# Patient Record
Sex: Female | Born: 1960 | Race: White | Hispanic: No | Marital: Single | Smoking: Current every day smoker
Health system: Southern US, Community
[De-identification: ages and names within clinical notes are randomized; demographics above are authoritative.]

## PROBLEM LIST (undated history)

## (undated) DIAGNOSIS — Z720 Tobacco use: Secondary | ICD-10-CM

## (undated) DIAGNOSIS — I639 Cerebral infarction, unspecified: Secondary | ICD-10-CM

## (undated) DIAGNOSIS — I236 Thrombosis of atrium, auricular appendage, and ventricle as current complications following acute myocardial infarction: Secondary | ICD-10-CM

## (undated) DIAGNOSIS — I209 Angina pectoris, unspecified: Secondary | ICD-10-CM

## (undated) DIAGNOSIS — R5600 Simple febrile convulsions: Secondary | ICD-10-CM

## (undated) DIAGNOSIS — I219 Acute myocardial infarction, unspecified: Secondary | ICD-10-CM

## (undated) DIAGNOSIS — I10 Essential (primary) hypertension: Secondary | ICD-10-CM

## (undated) DIAGNOSIS — C801 Malignant (primary) neoplasm, unspecified: Secondary | ICD-10-CM

## (undated) DIAGNOSIS — Z7902 Long term (current) use of antithrombotics/antiplatelets: Secondary | ICD-10-CM

## (undated) DIAGNOSIS — I5022 Chronic systolic (congestive) heart failure: Secondary | ICD-10-CM

## (undated) DIAGNOSIS — R569 Unspecified convulsions: Secondary | ICD-10-CM

## (undated) DIAGNOSIS — F32A Depression, unspecified: Secondary | ICD-10-CM

## (undated) DIAGNOSIS — Z7982 Long term (current) use of aspirin: Secondary | ICD-10-CM

## (undated) DIAGNOSIS — R0600 Dyspnea, unspecified: Secondary | ICD-10-CM

## (undated) DIAGNOSIS — F419 Anxiety disorder, unspecified: Secondary | ICD-10-CM

## (undated) DIAGNOSIS — Z955 Presence of coronary angioplasty implant and graft: Secondary | ICD-10-CM

## (undated) DIAGNOSIS — E785 Hyperlipidemia, unspecified: Secondary | ICD-10-CM

## (undated) DIAGNOSIS — I700 Atherosclerosis of aorta: Secondary | ICD-10-CM

## (undated) DIAGNOSIS — J45909 Unspecified asthma, uncomplicated: Secondary | ICD-10-CM

## (undated) DIAGNOSIS — I2510 Atherosclerotic heart disease of native coronary artery without angina pectoris: Secondary | ICD-10-CM

## (undated) DIAGNOSIS — I739 Peripheral vascular disease, unspecified: Secondary | ICD-10-CM

## (undated) HISTORY — DX: Hyperlipidemia, unspecified: E78.5

## (undated) HISTORY — PX: THYROIDECTOMY: SHX17

## (undated) HISTORY — PX: BACK SURGERY: SHX140

## (undated) HISTORY — DX: Cerebral infarction, unspecified: I63.9

## (undated) HISTORY — DX: Essential (primary) hypertension: I10

## (undated) HISTORY — DX: Acute myocardial infarction, unspecified: I21.9

## (undated) HISTORY — PX: ABDOMINAL HYSTERECTOMY: SHX81

## (undated) HISTORY — DX: Atherosclerotic heart disease of native coronary artery without angina pectoris: I25.10

## (undated) HISTORY — DX: Unspecified asthma, uncomplicated: J45.909

## (undated) HISTORY — DX: Malignant (primary) neoplasm, unspecified: C80.1

## (undated) HISTORY — PX: CORONARY ANGIOPLASTY: SHX604

## (undated) HISTORY — DX: Tobacco use: Z72.0

## (undated) HISTORY — PX: THROAT SURGERY: SHX803

## (undated) HISTORY — DX: Chronic systolic (congestive) heart failure: I50.22

---

## 2005-07-03 ENCOUNTER — Inpatient Hospital Stay: Payer: Self-pay | Admitting: Internal Medicine

## 2005-07-03 ENCOUNTER — Other Ambulatory Visit: Payer: Self-pay

## 2005-07-06 ENCOUNTER — Other Ambulatory Visit: Payer: Self-pay

## 2005-07-06 ENCOUNTER — Emergency Department: Payer: Self-pay | Admitting: Emergency Medicine

## 2005-07-28 ENCOUNTER — Emergency Department: Payer: Self-pay | Admitting: Emergency Medicine

## 2005-09-26 ENCOUNTER — Ambulatory Visit: Payer: Self-pay | Admitting: Otolaryngology

## 2006-12-31 ENCOUNTER — Emergency Department: Payer: Self-pay | Admitting: Emergency Medicine

## 2007-09-13 ENCOUNTER — Other Ambulatory Visit: Payer: Self-pay

## 2007-09-13 ENCOUNTER — Emergency Department: Payer: Self-pay | Admitting: Emergency Medicine

## 2007-11-07 IMAGING — CT CT CHEST W/ CM
2 series · 16 of 32 positions shown, 20 images · non-contrast
Comparison: none

REASON FOR EXAM: Shortness of breath//RM18
COMMENTS:

[Series 4: soft tissue · axial · 0.67mm/px · z∈[+261,+315]mm · 2 of 114 slices shown]
[im 9/114  mediastinal]
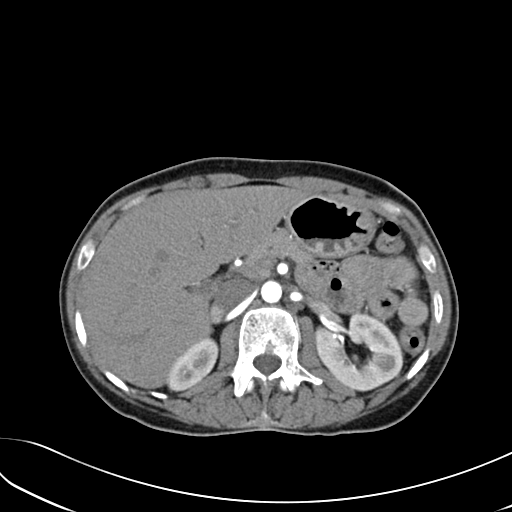
[im 27/114  mediastinal]
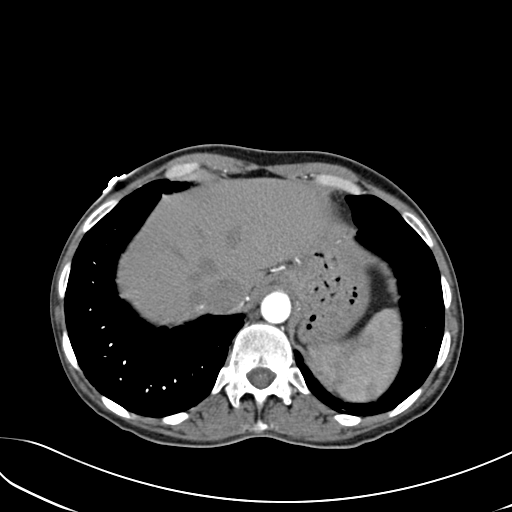

[Series 5: lung windows · axial · 0.67mm/px · z∈[+270,+549]mm · 14 of 111 slices shown, 18 images]
[im 9/111  mediastinal]
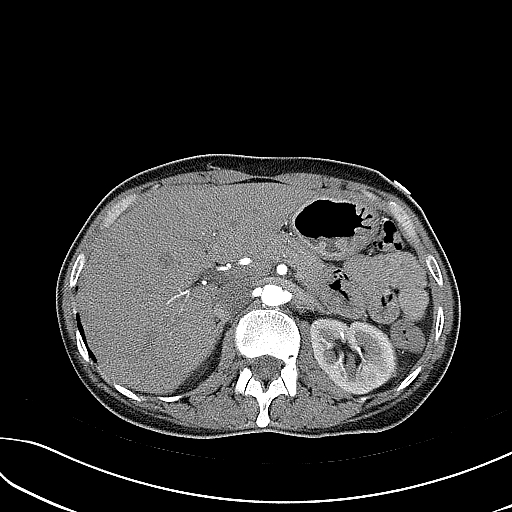
[im 9/111  lung]
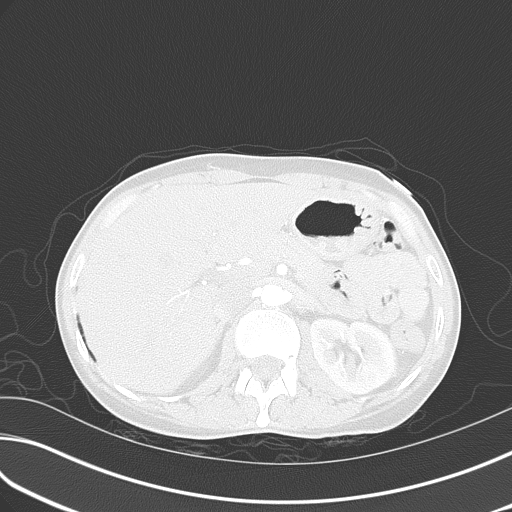
[im 17/111  lung]
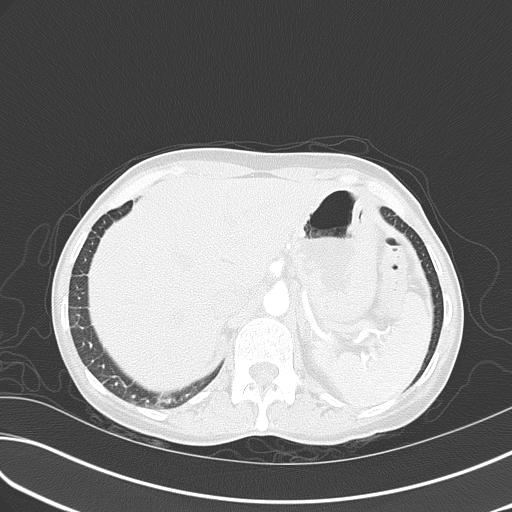
[im 26/111  lung]
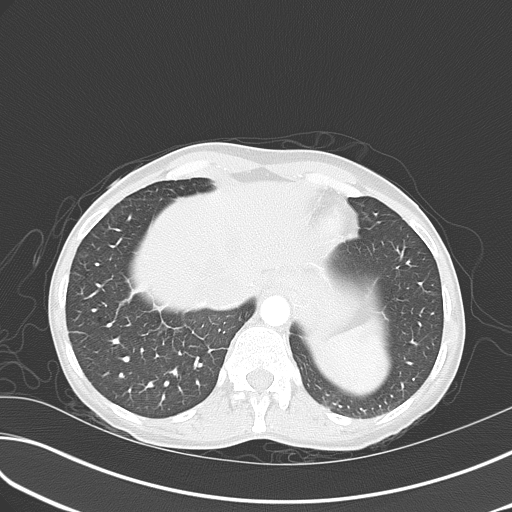
[im 34/111  lung]
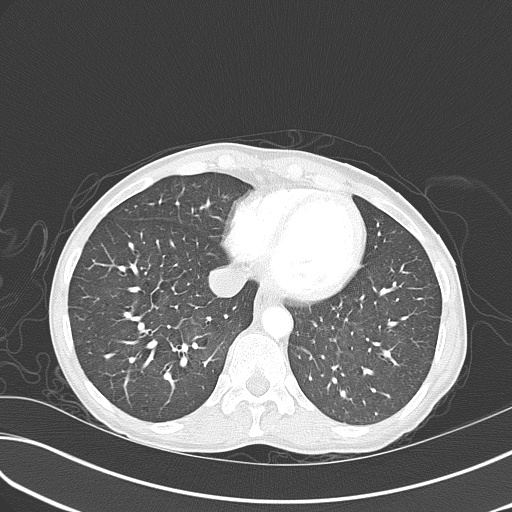
[im 43/111  mediastinal]
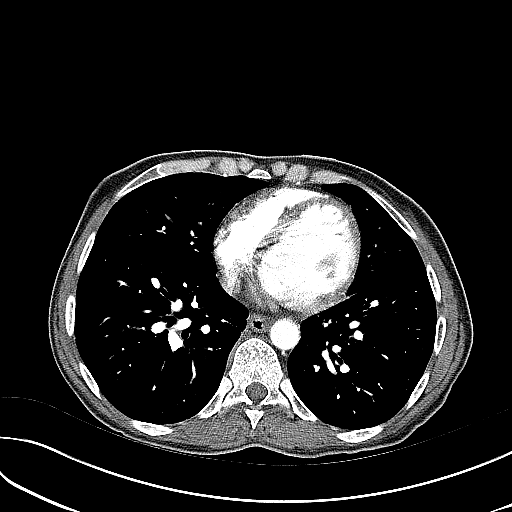
[im 43/111  lung]
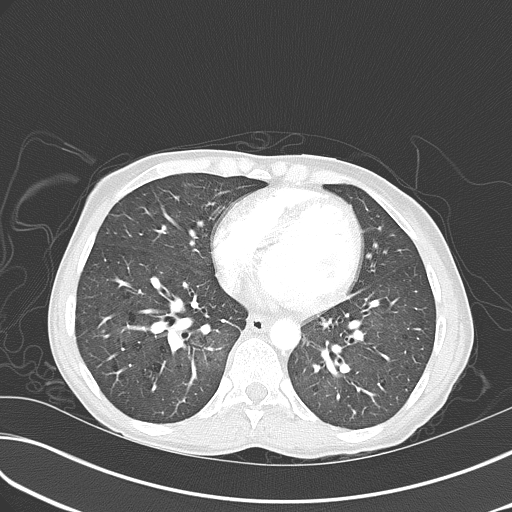
[im 51/111  lung]
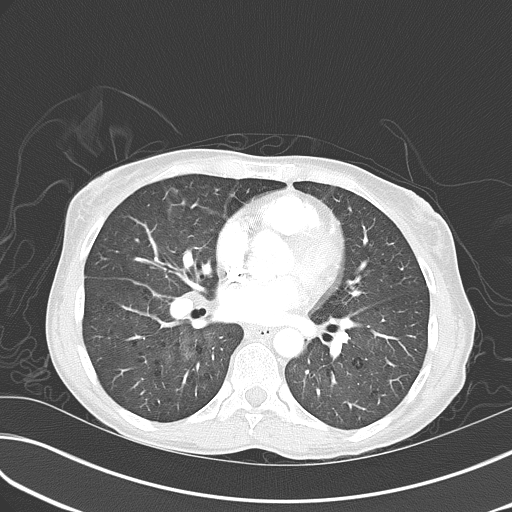
[im 52/111  lung]
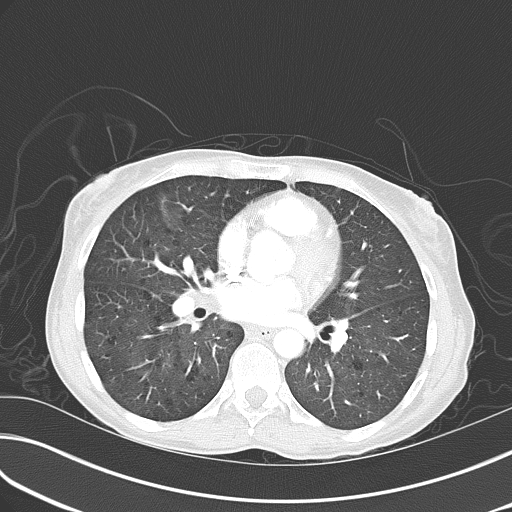
[im 56/111  lung]
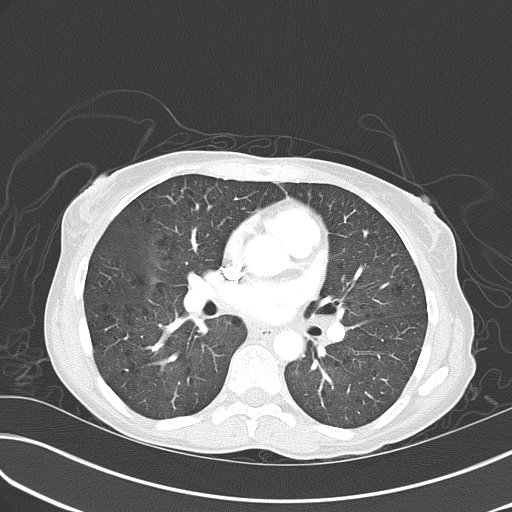
[im 60/111  mediastinal]
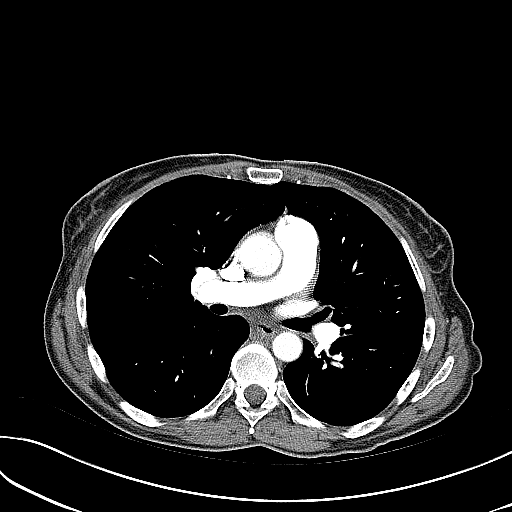
[im 60/111  lung]
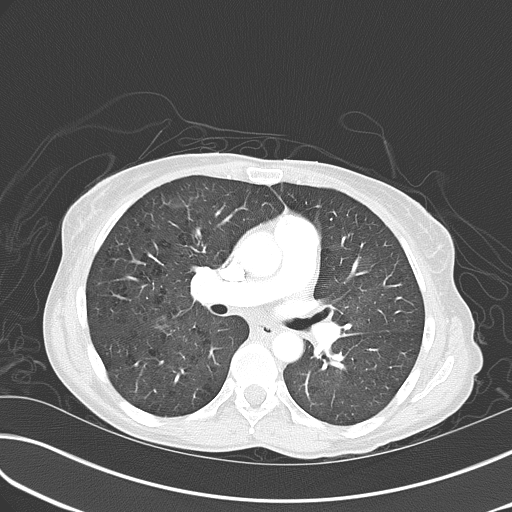
[im 68/111  lung]
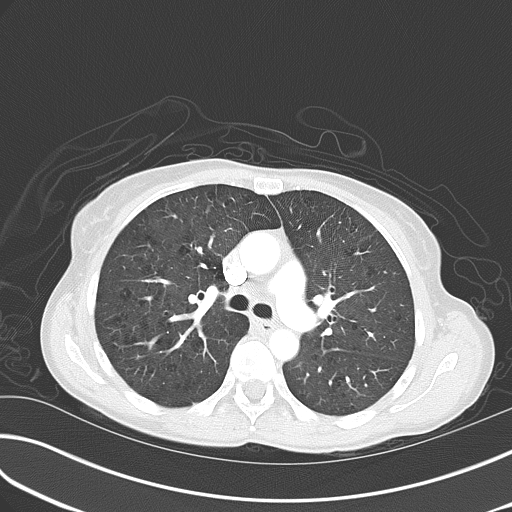
[im 77/111  lung]
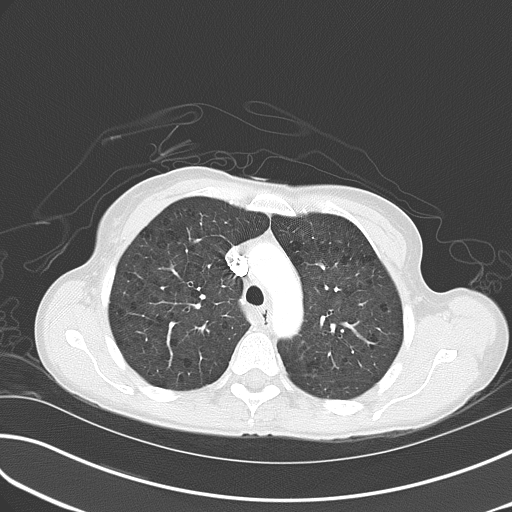
[im 85/111  lung]
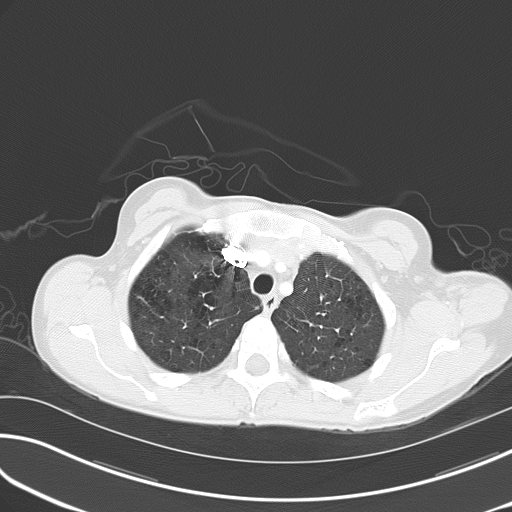
[im 94/111  mediastinal]
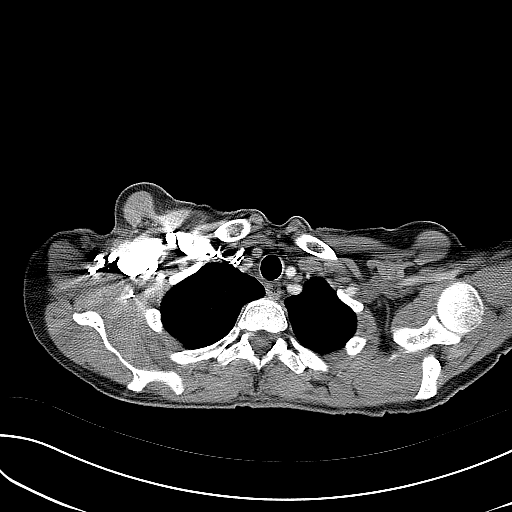
[im 94/111  lung]
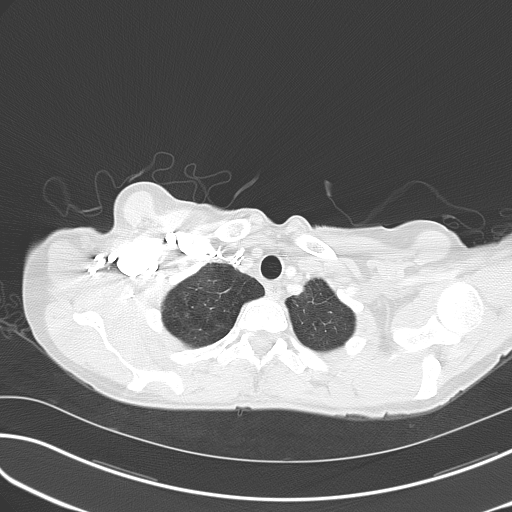
[im 102/111  lung]
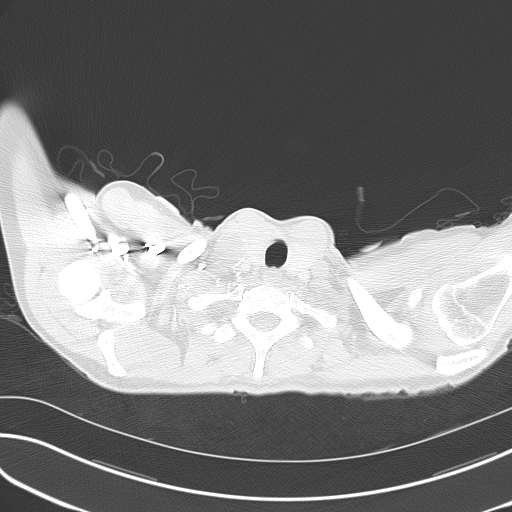

[16 of 32 positions shown; findings below may reference images not displayed]

PROCEDURE:     CT  - CT CHEST (FOR PE) W  - July 03, 2005  [DATE]

RESULT:

REASON FOR CONSULTATION:  Shortness of breath.

The exam was performed on an emergency basis.  There is a good bolus of
contrast in the pulmonary arteries.  No filling defects are noted to suggest
pulmonary emboli.

No mediastinal masses are noted.

On the lung window settings the lung fields are clear.  No effusions are
seen.
IMPRESSION: No evidence of pulmonary embolus.  The report was called to the emergency
room at the conclusion of the dictation.

## 2007-11-07 IMAGING — CR DG CHEST 1V PORT
1 series · 1 of 1 positions shown · non-contrast
Comparison: none

REASON FOR EXAM: Shortness of breath//RM18
COMMENTS:

PROCEDURE:     DXR - DXR PORTABLE CHEST SINGLE VIEW  - July 03, 2005  [DATE]
RESULT:     AP view of the chest shows the lung fields to be clear.  No
pneumonia, pneumothorax, or pleural effusion is seen.  Heart size is normal.
 Monitoring electrodes are present.

[view not recorded]
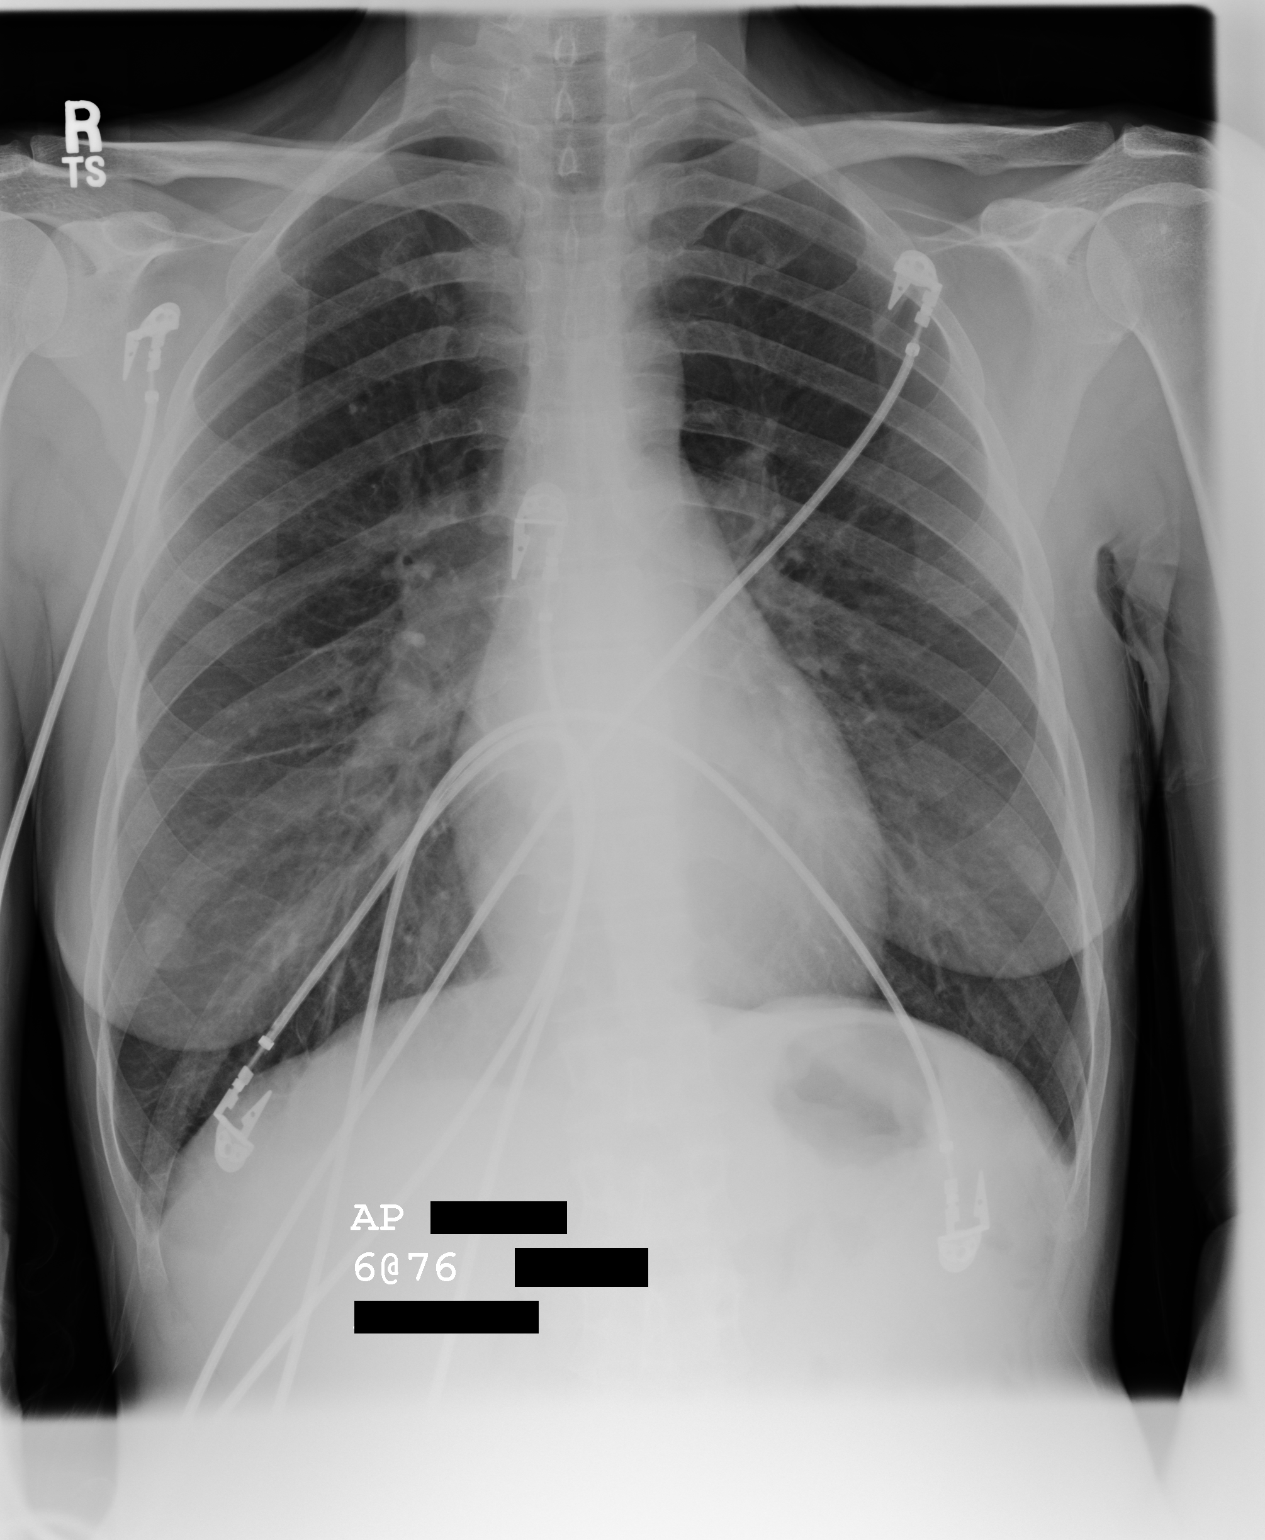

[1 of 1 positions shown; findings below may reference images not displayed]

IMPRESSION: No acute changes are identified.

## 2007-11-10 IMAGING — CR DG CHEST 1V PORT
1 series · 1 of 1 positions shown · non-contrast
Comparison: none

REASON FOR EXAM: chest pain
COMMENTS:

[view not recorded]
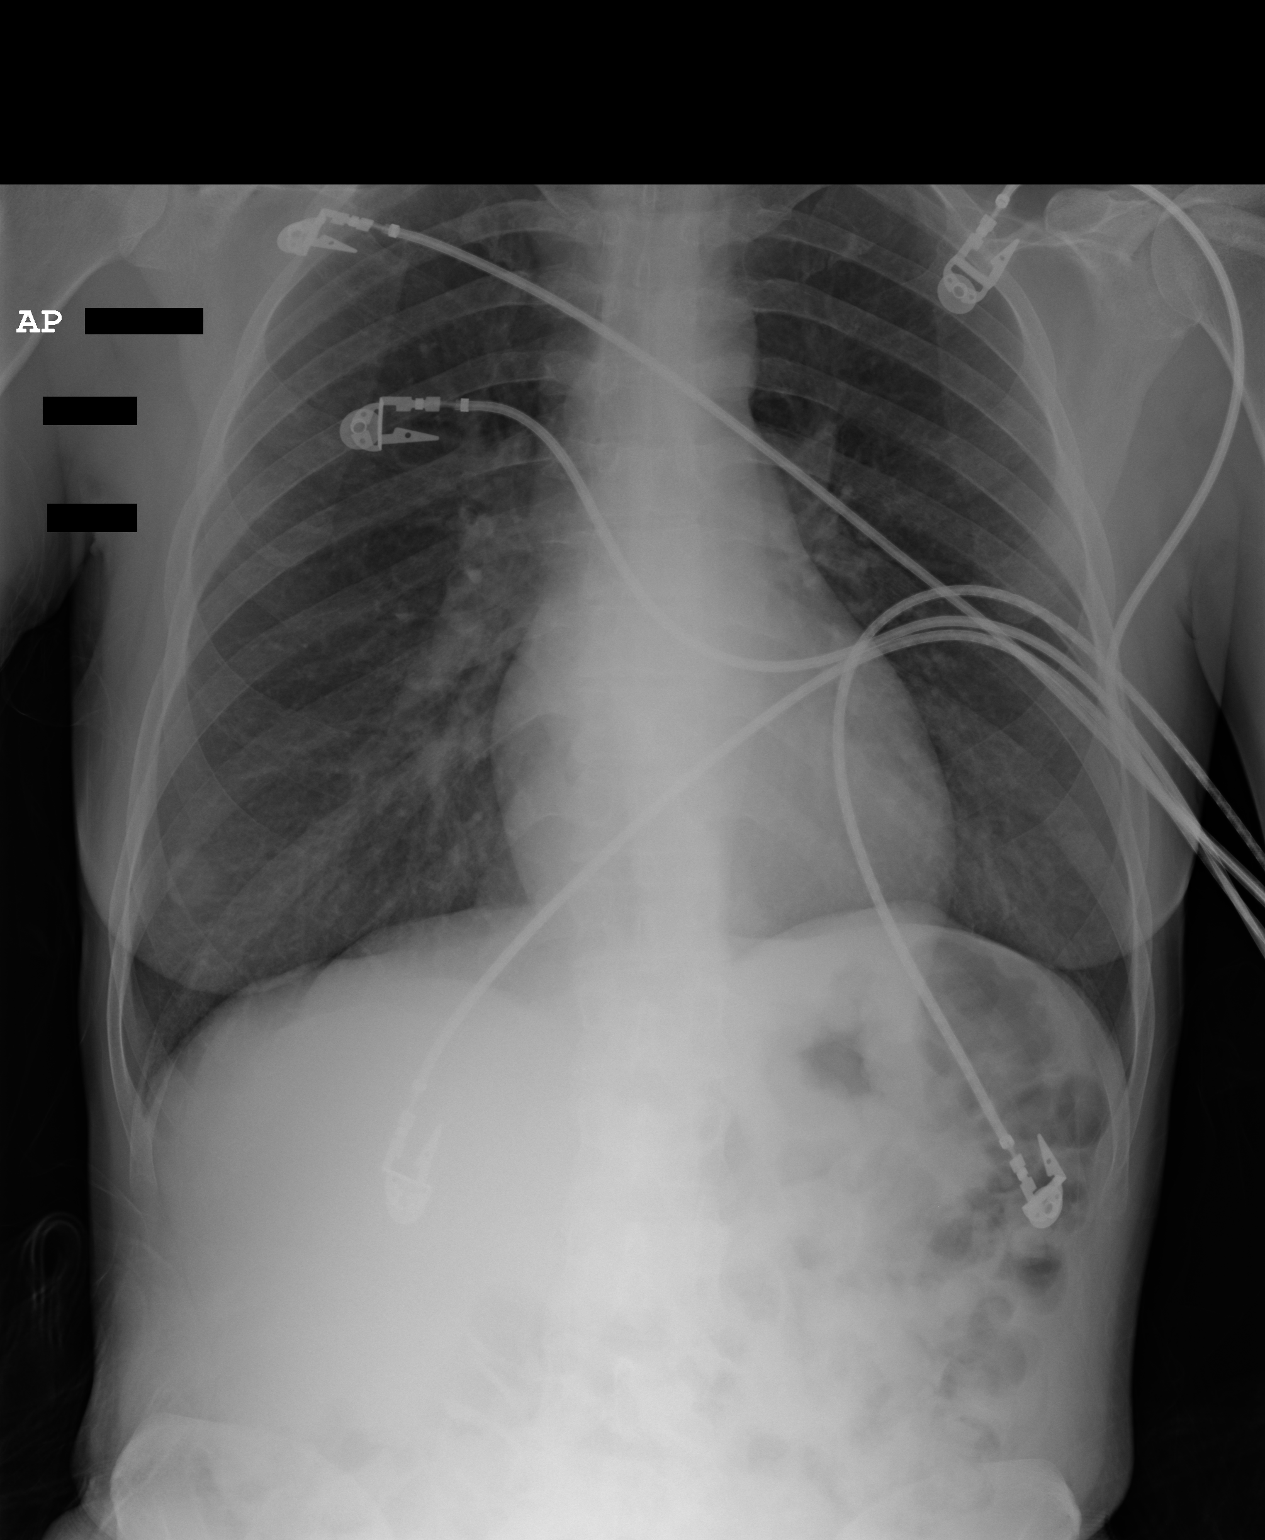

[1 of 1 positions shown; findings below may reference images not displayed]

PROCEDURE:     DXR - DXR PORTABLE CHEST SINGLE VIEW  - July 06, 2005  [DATE]

RESULT:          A single AP view was obtained and compared to the prior
study of 07/03/2005.

The heart appears within normal limits in size. The lung fields are clear.
The vascularity is within normal limits with no effusions.
IMPRESSION: No acute cardiopulmonary disease is noted of the chest.
 The chest is unchanged from the prior study.

## 2008-02-13 HISTORY — PX: CARDIAC CATHETERIZATION: SHX172

## 2008-05-13 DIAGNOSIS — I255 Ischemic cardiomyopathy: Secondary | ICD-10-CM

## 2008-05-13 DIAGNOSIS — I2510 Atherosclerotic heart disease of native coronary artery without angina pectoris: Secondary | ICD-10-CM

## 2008-05-13 DIAGNOSIS — I5020 Unspecified systolic (congestive) heart failure: Secondary | ICD-10-CM

## 2008-05-13 HISTORY — DX: Atherosclerotic heart disease of native coronary artery without angina pectoris: I25.10

## 2008-05-13 HISTORY — DX: Unspecified systolic (congestive) heart failure: I50.20

## 2008-05-13 HISTORY — DX: Ischemic cardiomyopathy: I25.5

## 2008-06-09 DIAGNOSIS — I2109 ST elevation (STEMI) myocardial infarction involving other coronary artery of anterior wall: Secondary | ICD-10-CM

## 2008-06-09 HISTORY — DX: ST elevation (STEMI) myocardial infarction involving other coronary artery of anterior wall: I21.09

## 2008-06-10 DIAGNOSIS — I236 Thrombosis of atrium, auricular appendage, and ventricle as current complications following acute myocardial infarction: Secondary | ICD-10-CM

## 2008-06-10 HISTORY — DX: Thrombosis of atrium, auricular appendage, and ventricle as current complications following acute myocardial infarction: I23.6

## 2008-06-10 HISTORY — PX: CORONARY ANGIOPLASTY WITH STENT PLACEMENT: SHX49

## 2011-12-25 ENCOUNTER — Ambulatory Visit: Payer: Self-pay | Admitting: Cardiovascular Disease

## 2011-12-27 ENCOUNTER — Encounter: Payer: Self-pay | Admitting: *Deleted

## 2011-12-28 ENCOUNTER — Encounter: Payer: Self-pay | Admitting: Cardiovascular Disease

## 2011-12-28 ENCOUNTER — Ambulatory Visit (INDEPENDENT_AMBULATORY_CARE_PROVIDER_SITE_OTHER): Payer: Medicaid Other | Admitting: Cardiovascular Disease

## 2011-12-28 VITALS — BP 108/72 | HR 60 | Ht 62.0 in | Wt 154.0 lb

## 2011-12-28 DIAGNOSIS — R0602 Shortness of breath: Secondary | ICD-10-CM

## 2011-12-28 DIAGNOSIS — R079 Chest pain, unspecified: Secondary | ICD-10-CM

## 2011-12-28 DIAGNOSIS — I2510 Atherosclerotic heart disease of native coronary artery without angina pectoris: Secondary | ICD-10-CM

## 2011-12-28 DIAGNOSIS — I5022 Chronic systolic (congestive) heart failure: Secondary | ICD-10-CM

## 2011-12-28 DIAGNOSIS — Z720 Tobacco use: Secondary | ICD-10-CM | POA: Insufficient documentation

## 2011-12-28 DIAGNOSIS — E785 Hyperlipidemia, unspecified: Secondary | ICD-10-CM

## 2011-12-28 DIAGNOSIS — F17200 Nicotine dependence, unspecified, uncomplicated: Secondary | ICD-10-CM

## 2011-12-28 MED ORDER — SIMVASTATIN 40 MG PO TABS: 40.0000 mg | ORAL_TABLET | Freq: Every day | ORAL | Status: DC

## 2011-12-28 MED ORDER — METOPROLOL SUCCINATE ER 25 MG PO TB24: 25.0000 mg | ORAL_TABLET | Freq: Every day | ORAL | Status: DC

## 2011-12-28 MED ORDER — CLOPIDOGREL BISULFATE 75 MG PO TABS: 75.0000 mg | ORAL_TABLET | Freq: Every day | ORAL | Status: DC

## 2011-12-28 NOTE — Assessment & Plan Note (Signed)
Resume simvastatin. She will need followup labs and lipid profile.

## 2011-12-28 NOTE — Assessment & Plan Note (Addendum)
The patient has known history of coronary artery disease with previous anterior myocardial infarction and significant ischemic cardiomyopathy. Unfortunately, she has not been taking any medications over the last 6 months. I asked her to resume aspirin 81 mg daily and Plavix 75 mg once daily. I will resume metoprolol XL but at a smaller dose of 25 mg once daily due to low blood pressure and heart rate of 60. I will hold off on starting lisinopril/Aldactone at this time given that she has not had any recent labs. These will likely  be resumed upon followup.  She is currently having mild chest discomfort but has not been on any medical therapy for 6 months. If she continues to have these symptoms, further ischemic evaluation will be pursued.

## 2011-12-28 NOTE — Assessment & Plan Note (Signed)
Most recent ejection fraction was in 2010. At that time it was 30%. Given her symptoms of dyspnea, I will request an echocardiogram to evaluate her LV systolic function.

## 2011-12-28 NOTE — Patient Instructions (Addendum)
Resume Aspirin 81 mg once daily, Plavix 75 mg once daily, Toprol 25 mg once daily and Simvastatin 40 mg daily.   Your physician has requested that you have an echocardiogram. Echocardiography is a painless test that uses sound waves to create images of your heart. It provides your doctor with information about the size and shape of your heart and how well your heart's chambers and valves are working. This procedure takes approximately one hour. There are no restrictions for this procedure.  Refer to Dr. Darrick Huntsman, Dr. Lorin Picket or Dr. Dan Humphreys to establish care.   Follow up after echo.

## 2011-12-28 NOTE — Assessment & Plan Note (Signed)
I had a prolonged discussion with her about the importance of smoking cessation. 

## 2011-12-28 NOTE — Progress Notes (Signed)
HPI  This is a 52 year old Caucasian female who is here today to establish cardiovascular care. She currently does not have a primary care physician and has not taken any of her medications over the last month. She has known history of coronary artery disease. She presented in April of 2010 to [facility] with acute anterior ST elevation myocardial infarction. The patient had significant mental status changes after she was given morphine and thus cardiac catheterization was delayed until after neurologic evaluation. CT scan showed evidence of 2 strokes. MRI showed that these were old strokes and not acute. She underwent cardiac catheterization which showed occluded mid LAD with moderate mid RCA stenosis. She underwent thrombectomy in bare-metal stent placement. Ejection fraction was 25% with anterior, anteroseptal and apical akinesis. She had cardiac MRI performed before hospital discharge which showed an ejection fraction of 30% with evidence of apical thrombus. He was discharged home on warfarin as well as other heart medications. It appears that she was on warfarin for a few months. The patient has not followed with the cardiologist since then. She used to go to the [facility] clinic but has not been following daily. She continues to smoke. She complains of occasional chest tightness when she is under stress. She has exertional dyspnea.  No Known Allergies   Current Outpatient Prescriptions on File Prior to Visit  Medication Sig Dispense Refill  . nitroGLYCERIN (NITROSTAT) 0.4 MG SL tablet Place 0.4 mg under the tongue every 5 (five) minutes as needed.      . gabapentin (NEURONTIN) 300 MG capsule Takes 1-2 tablets daily at bedtime.      . simvastatin (ZOCOR) 40 MG tablet Take 1 tablet (40 mg total) by mouth daily.  30 tablet  6     Past Medical History  Diagnosis Date  . Asthma   . Hypertension   . MI (myocardial infarction)   . Cancer   . Coronary artery disease  05/2008    Acute anterior ST elevation myocardial infarction . Delayed intervention at [facility] due to mental status changes and suspected stroke. MRI showed old strokes. Cardiac cath showed an occluded mid LAD with 50% mid RCA stenosis and mild left circumflex disease. She had thrombectomy and bare-metal stent placement to the mid LAD (2.75 x 24 mm). Ejection fraction was 25%  . Hyperlipidemia   . Stroke     Noted on MRI in 2010  . Chronic systolic heart failure     Due to postinfarct cardiomyopathy. Ejection fraction was 30% in 2010 with anterior wall akinesis  . Tobacco abuse      Past Surgical History  Procedure Date  . Thyroidectomy   . Throat surgery   . Back surgery   . Cardiac catheterization 2010    [facility]  . Coronary angioplasty      History reviewed. No pertinent family history.   History   Social History  . Marital Status: Single    Spouse Name: N/A    Number of Children: N/A  . Years of Education: N/A   Occupational History  . Not on file.   Social History Main Topics  . Smoking status: Current Some Day Smoker -- 0.5 packs/day for 25 years    Types: Cigarettes  . Smokeless tobacco: Not on file  . Alcohol Use: No  . Drug Use: No  . Sexually Active:    Other Topics Concern  . Not on file   Social History Narrative  . No narrative on  file     ROS Constitutional: Negative for fever, chills, diaphoresis, activity change, appetite change and fatigue.  HENT: Negative for hearing loss, nosebleeds, congestion, sore throat, facial swelling, drooling, trouble swallowing, neck pain, voice change, sinus pressure and tinnitus.  Eyes: Negative for photophobia, pain, discharge and visual disturbance.  Respiratory: Negative for apnea, cough and wheezing.  Cardiovascular: Negative for chest pain, palpitations and leg swelling.  Gastrointestinal: Negative for nausea, vomiting, abdominal pain, diarrhea, constipation, blood in stool and abdominal distention.    Genitourinary: Negative for dysuria, urgency, frequency, hematuria and decreased urine volume.  Musculoskeletal: Negative for myalgias, back pain, joint swelling, arthralgias and gait problem.  Skin: Negative for color change, pallor, rash and wound.  Neurological: Negative for dizziness, tremors, seizures, syncope, speech difficulty, weakness, light-headedness, numbness and headaches.  Psychiatric/Behavioral: Negative for suicidal ideas, hallucinations, behavioral problems and agitation. The patient is not nervous/anxious.     PHYSICAL EXAM   BP 108/72  Pulse 60  Ht 5\' 2"  (1.575 m)  Wt 154 lb (69.854 kg)  BMI 28.17 kg/m2 Constitutional: She is oriented to person, place, and time. She appears well-developed and well-nourished. No distress.  HENT: No nasal discharge.  Head: Normocephalic and atraumatic.  Eyes: Pupils are equal and round. Right eye exhibits no discharge. Left eye exhibits no discharge.  Neck: Normal range of motion. Neck supple. No JVD present. No thyromegaly present.  Cardiovascular: Normal rate, regular rhythm, normal heart sounds. Exam reveals no gallop and no friction rub. No murmur heard.  Pulmonary/Chest: Effort normal and breath sounds normal. No stridor. No respiratory distress. She has no wheezes. She has no rales. She exhibits no tenderness.  Abdominal: Soft. Bowel sounds are normal. She exhibits no distension. There is no tenderness. There is no rebound and no guarding.  Musculoskeletal: Normal range of motion. She exhibits no edema and no tenderness.  Neurological: She is alert and oriented to person, place, and time. Coordination normal.  Skin: Skin is warm and dry. No rash noted. She is not diaphoretic. No erythema. No pallor.  Psychiatric: She has a normal mood and affect. Her behavior is normal. Judgment and thought content normal.     EKG: Sinus  Rhythm  Low voltage in precordial leads.   - Extensive anterior-lateral infarct  (age undetermined).    ABNORMAL    ASSESSMENT AND PLAN

## 2011-12-31 ENCOUNTER — Other Ambulatory Visit (INDEPENDENT_AMBULATORY_CARE_PROVIDER_SITE_OTHER): Payer: Medicaid Other

## 2011-12-31 ENCOUNTER — Other Ambulatory Visit: Payer: Self-pay

## 2011-12-31 ENCOUNTER — Other Ambulatory Visit: Payer: Self-pay | Admitting: Cardiovascular Disease

## 2011-12-31 DIAGNOSIS — R079 Chest pain, unspecified: Secondary | ICD-10-CM

## 2011-12-31 DIAGNOSIS — I5022 Chronic systolic (congestive) heart failure: Secondary | ICD-10-CM

## 2011-12-31 DIAGNOSIS — I2510 Atherosclerotic heart disease of native coronary artery without angina pectoris: Secondary | ICD-10-CM

## 2011-12-31 DIAGNOSIS — R0602 Shortness of breath: Secondary | ICD-10-CM

## 2012-01-04 ENCOUNTER — Encounter: Payer: Self-pay | Admitting: Cardiovascular Disease

## 2012-01-04 ENCOUNTER — Ambulatory Visit (INDEPENDENT_AMBULATORY_CARE_PROVIDER_SITE_OTHER): Payer: Medicaid Other | Admitting: Cardiovascular Disease

## 2012-01-04 VITALS — BP 110/80 | HR 74 | Ht 62.0 in | Wt 154.0 lb

## 2012-01-04 DIAGNOSIS — I238 Other current complications following acute myocardial infarction: Secondary | ICD-10-CM

## 2012-01-04 DIAGNOSIS — I5022 Chronic systolic (congestive) heart failure: Secondary | ICD-10-CM

## 2012-01-04 DIAGNOSIS — R079 Chest pain, unspecified: Secondary | ICD-10-CM

## 2012-01-04 DIAGNOSIS — I219 Acute myocardial infarction, unspecified: Secondary | ICD-10-CM

## 2012-01-04 DIAGNOSIS — I236 Thrombosis of atrium, auricular appendage, and ventricle as current complications following acute myocardial infarction: Secondary | ICD-10-CM

## 2012-01-04 DIAGNOSIS — E785 Hyperlipidemia, unspecified: Secondary | ICD-10-CM

## 2012-01-04 DIAGNOSIS — I2510 Atherosclerotic heart disease of native coronary artery without angina pectoris: Secondary | ICD-10-CM

## 2012-01-04 DIAGNOSIS — I509 Heart failure, unspecified: Secondary | ICD-10-CM

## 2012-01-04 MED ORDER — SPIRONOLACTONE 25 MG PO TABS: 25.0000 mg | ORAL_TABLET | Freq: Every day | ORAL | Status: DC

## 2012-01-04 MED ORDER — LISINOPRIL 5 MG PO TABS: 5.0000 mg | ORAL_TABLET | Freq: Every day | ORAL | Status: DC

## 2012-01-04 MED ORDER — WARFARIN SODIUM 5 MG PO TABS: 5.0000 mg | ORAL_TABLET | Freq: Every day | ORAL | Status: DC

## 2012-01-04 NOTE — Assessment & Plan Note (Signed)
She was started recently on simvastatin. She will need a followup fasting lipid profile in 4 weeks.

## 2012-01-04 NOTE — Patient Instructions (Addendum)
Stop Aspirin. Start Lisinopril 5 mg once daily.  Start Aldactone 25 mg once daily.  Start Warfarin 5 mg at bedtime.  Labs in 5 days.  Enrol in Coumadin clinic.  Follow up in 2 weeks.

## 2012-01-04 NOTE — Assessment & Plan Note (Signed)
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 I recommend starting warfarin today and enrolling her in our anticoagulation clinic. She has been on warfarin in the past and understands the risks. Target INR is between 2 and 3.

## 2012-01-04 NOTE — Progress Notes (Signed)
HPI  This is a 51 year old Caucasian female who is here today for a followup visit.  She has known history of coronary artery disease. She presented in April of 2010 to [facility] with acute anterior ST elevation myocardial infarction. The patient had significant mental status changes after she was given morphine and thus cardiac catheterization was delayed until after neurologic evaluation. CT scan showed evidence of 2 strokes. MRI showed that these were old strokes and not acute. She underwent cardiac catheterization which showed occluded mid LAD with moderate mid RCA stenosis. She underwent thrombectomy and bare-metal stent placement to the LAD. Ejection fraction was 25% with anterior, anteroseptal and apical akinesis. She had cardiac MRI performed before hospital discharge which showed an ejection fraction of 30% with evidence of apical thrombus. He was discharged home on warfarin as well as other heart medications. It appears that she was on warfarin for a few months. The patient has not followed with the cardiologist since then. She used to go to the [facility] clinic but not anymore. She was without any of her cardiac medications for 6 months. During last visit, I resumed her Toprol, simvastatin, Plavix and aspirin. Overall she feels better with less chest pain. 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  No Known Allergies   Current Outpatient Prescriptions on File Prior to Visit  Medication Sig Dispense Refill  . clopidogrel (PLAVIX) 75 MG tablet Take 1 tablet (75 mg total) by mouth daily.  30 tablet  6  . gabapentin (NEURONTIN) 300 MG capsule Takes 1-2 tablets daily at bedtime.      . metoprolol succinate (TOPROL XL) 25 MG 24 hr tablet Take 1 tablet (25 mg total) by mouth daily.  30 tablet  6  . nitroGLYCERIN (NITROSTAT) 0.4 MG  SL tablet Place 0.4 mg under the tongue every 5 (five) minutes as needed.      . simvastatin (ZOCOR) 40 MG tablet Take 1 tablet (40 mg total) by mouth daily.  30 tablet  6  . warfarin (COUMADIN) 5 MG tablet Take 1 tablet (5 mg total) by mouth at bedtime.  30 tablet  3     Past Medical History  Diagnosis Date  . Asthma   . Hypertension   . MI (myocardial infarction)   . Cancer   . Coronary artery disease 05/2008    Acute anterior ST elevation myocardial infarction . Delayed intervention at [facility] due to mental status changes and suspected stroke. MRI showed old strokes. Cardiac cath showed an occluded mid LAD with 50% mid RCA stenosis and mild left circumflex disease. She had thrombectomy and bare-metal stent placement to the mid LAD (2.75 x 24 mm). Ejection fraction was 25%  . Hyperlipidemia   . Stroke     Noted on MRI in 2010  . Chronic systolic heart failure     Due to postinfarct cardiomyopathy. Ejection fraction was 30% in 2010 with anterior wall akinesis  . Tobacco abuse      Past Surgical History  Procedure Date  . Thyroidectomy   . Throat surgery   . Back surgery   . Cardiac catheterization 2010    [facility]  . Coronary angioplasty      History reviewed. No pertinent family history.   History   Social History  . Marital Status: Single    Spouse Name: N/A  Number of Children: N/A  . Years of Education: N/A   Occupational History  . Not on file.   Social History Main Topics  . Smoking status: Current Some Day Smoker -- 0.2 packs/day for 25 years    Types: Cigarettes  . Smokeless tobacco: Not on file  . Alcohol Use: No  . Drug Use: No  . Sexually Active:    Other Topics Concern  . Not on file   Social History Narrative  . No narrative on file        PHYSICAL EXAM   BP 110/80  Pulse 74  Ht 5\' 2"  (1.575 m)  Wt 154 lb (69.854 kg)  BMI 28.17 kg/m2 Constitutional: She is oriented to person, place, and time. She appears well-developed and  well-nourished. No distress.  HENT: No nasal discharge.  Head: Normocephalic and atraumatic.  Eyes: Pupils are equal and round. Right eye exhibits no discharge. Left eye exhibits no discharge.  Neck: Normal range of motion. Neck supple. No JVD present. No thyromegaly present.  Cardiovascular: Normal rate, regular rhythm, normal heart sounds. Exam reveals no gallop and no friction rub. No murmur heard.  Pulmonary/Chest: Effort normal and breath sounds normal. No stridor. No respiratory distress. She has no wheezes. She has no rales. She exhibits no tenderness.  Abdominal: Soft. Bowel sounds are normal. She exhibits no distension. There is no tenderness. There is no rebound and no guarding.  Musculoskeletal: Normal range of motion. She exhibits no edema and no tenderness.  Neurological: She is alert and oriented to person, place, and time. Coordination normal.  Skin: Skin is warm and dry. No rash noted. She is not diaphoretic. No erythema. No pallor.  Psychiatric: She has a normal mood and affect. Her behavior is normal. Judgment and thought content normal.     EKG: Sinus  Rhythm  Low voltage in precordial leads.   - Extensive anterior-lateral infarct  (age undetermined).   ABNORMAL    ASSESSMENT AND PLAN

## 2012-01-04 NOTE — Assessment & Plan Note (Signed)
Echocardiogram showed an ejection fraction of 25-30% with apical aneurysm and anterior wall akinesis.  I will resume small dose of lisinopril as well as spironolactone. I will check basic metabolic profile in 5 days. She used to be on these medications before. She will need to have repeat evaluation of her ejection fraction once he is on optimal medical therapy for at least 3 months to see if an ICD is indicated.

## 2012-01-04 NOTE — Assessment & Plan Note (Signed)
Her chest pain improved after she was started back on some of her cardiac medications. I will continue to monitor this. If symptoms persist, cardiac catheterization will be considered.

## 2012-01-09 ENCOUNTER — Other Ambulatory Visit: Payer: Medicaid Other

## 2012-01-14 ENCOUNTER — Ambulatory Visit: Payer: Medicaid Other | Admitting: Cardiovascular Disease

## 2012-01-15 ENCOUNTER — Telehealth: Payer: Self-pay | Admitting: Cardiovascular Disease

## 2012-01-15 NOTE — Telephone Encounter (Signed)
Tried multiple time to contact pt to rs missed appt. Letter for missed appt was also mailed to pt.

## 2012-01-18 ENCOUNTER — Telehealth: Payer: Self-pay

## 2012-01-18 NOTE — Telephone Encounter (Signed)
Attempted to reach pt via phone # listed, no answer "mailbox is full"

## 2012-01-21 ENCOUNTER — Ambulatory Visit: Payer: Medicaid Other | Admitting: Cardiovascular Disease

## 2012-01-21 NOTE — Telephone Encounter (Signed)
I called #'s provided and was able to speak with pt's husband He says pt is sleeping, says she did not get home until 0230 this am from an "emergency with our daughter" Husband is aware pt should have come for an appt and is aware she is taking coumadin that needs to be checked ASAP He says he will call us back in 2 days to r/s I explained urgency of this and necessity of r/s appt He verb understanding I will call pt back again tomm

## 2012-01-21 NOTE — Telephone Encounter (Signed)
I spoke with pt's dtr, who gave me 2 new #'s for pt [phone] and [phone]

## 2012-01-22 NOTE — Telephone Encounter (Signed)
LMTCB

## 2012-01-23 NOTE — Telephone Encounter (Signed)
2nd phone # contact attempt: LM with family member to have pt call me back

## 2012-01-23 NOTE — Telephone Encounter (Signed)
lmtcb

## 2012-01-24 NOTE — Telephone Encounter (Signed)
lmtcb

## 2012-01-28 NOTE — Telephone Encounter (Signed)
LMTCB with pt's dtr on number #2

## 2012-01-28 NOTE — Telephone Encounter (Signed)
lmtcb on number #1

## 2012-02-01 ENCOUNTER — Ambulatory Visit: Payer: Medicaid Other | Admitting: Cardiovascular Disease

## 2012-02-01 ENCOUNTER — Other Ambulatory Visit: Payer: Medicaid Other

## 2012-02-01 NOTE — Telephone Encounter (Signed)
Pt has appt today 12/20

## 2012-02-01 NOTE — Telephone Encounter (Signed)
No answer - mailbox is full

## 2012-02-01 NOTE — Telephone Encounter (Signed)
pt's husband called today to tell me they are unable to find a ride therefore pt cannot make it to appts today I explained importance of these appts and asked if there was ANY way pt could be seen today Husband says "no". I confirmed pt still taking warfarin and explained how dangerous this is since we have not been able to check INR since warfarin start d/t noncompliance. Husband verb. Understanding and r/s appt for Monday 12/23 at 1000. He promises me he will find a ride for pt We will then r/s Dr. Kirke Corin appt for the week after next, when he returns from vacation.

## 2012-02-04 ENCOUNTER — Other Ambulatory Visit: Payer: Medicaid Other

## 2012-02-07 ENCOUNTER — Telehealth: Payer: Self-pay

## 2012-02-07 NOTE — Telephone Encounter (Signed)
LMTCB re: multiple missed appointments and the need to come in to have INR checked

## 2012-02-12 NOTE — Telephone Encounter (Signed)
FYI (see multiple telephone notes/attempts): Trying to reach pt [id number] appts and missed visits with Dr. Kirke Corin and importance of f/u Unable to reach

## 2012-02-12 NOTE — Telephone Encounter (Signed)
lmtcb

## 2012-02-14 NOTE — Telephone Encounter (Signed)
Called [facility] pharmacy on [address], cancelled Warfarin rx and refills since pt has failed to f/u as directed for INR checks.  Pt has repeatedly been made aware of risks of bleeding and adverse effects of being on Coumadin unmonitored.  Pt is also aware of risks of clotting not taking the Coumadin, but pt has failed to f/u with INR checks therefore we can no longer safely rx Coumadin for pt risks exceed benefits. Will await pt call back to address further.

## 2012-03-10 ENCOUNTER — Ambulatory Visit: Payer: Medicaid Other | Admitting: Cardiovascular Disease

## 2012-03-17 ENCOUNTER — Ambulatory Visit: Payer: Medicaid Other | Admitting: Cardiovascular Disease

## 2012-04-01 ENCOUNTER — Encounter: Payer: Self-pay | Admitting: Cardiovascular Disease

## 2012-04-01 ENCOUNTER — Encounter: Payer: Self-pay | Admitting: *Deleted

## 2012-04-22 ENCOUNTER — Telehealth: Payer: Self-pay | Admitting: Cardiovascular Disease

## 2012-04-22 NOTE — Telephone Encounter (Signed)
Patient dismissed from [facility] by Lorine Bears, MD, effective 04/01/2012. Dismissal letter sent out by certified / registered mail. rmf  Received signed domestic return receipt verifying delivery of certified letter on 04/05/2012. Article number [id number] [phone]. rmf

## 2012-12-31 ENCOUNTER — Other Ambulatory Visit: Payer: Self-pay | Admitting: Cardiovascular Disease

## 2012-12-31 NOTE — Telephone Encounter (Signed)
Patient dismissed from [facility] by Lorine Bears, MD, effective 04/01/2012.

## 2012-12-31 NOTE — Telephone Encounter (Signed)
LMTCB pt needs to schedule future appointment with Dr. Kirke Corin. Pt is overdue has not been seen since last yr. Pt request rx refill for Plavix and metoprolol.

## 2013-06-19 DIAGNOSIS — I2119 ST elevation (STEMI) myocardial infarction involving other coronary artery of inferior wall: Secondary | ICD-10-CM

## 2013-06-19 HISTORY — DX: ST elevation (STEMI) myocardial infarction involving other coronary artery of inferior wall: I21.19

## 2013-06-20 ENCOUNTER — Emergency Department: Payer: Self-pay | Admitting: Emergency Medicine

## 2013-06-20 HISTORY — PX: CORONARY ANGIOPLASTY WITH STENT PLACEMENT: SHX49

## 2013-06-20 LAB — CBC WITH DIFFERENTIAL/PLATELET
Basophil #: 0.1 10*3/uL (ref 0.0–0.1)
Basophil %: 0.4 %
EOS ABS: 0.1 10*3/uL (ref 0.0–0.7)
Eosinophil %: 0.4 %
HCT: 46.2 % (ref 35.0–47.0)
HGB: 15.6 g/dL (ref 12.0–16.0)
Lymphocyte #: 1.9 10*3/uL (ref 1.0–3.6)
Lymphocyte %: 12.5 %
MCH: 34.3 pg — AB (ref 26.0–34.0)
MCHC: 33.7 g/dL (ref 32.0–36.0)
MCV: 102 fL — ABNORMAL HIGH (ref 80–100)
MONOS PCT: 4.4 %
Monocyte #: 0.7 x10 3/mm (ref 0.2–0.9)
NEUTROS ABS: 12.4 10*3/uL — AB (ref 1.4–6.5)
Neutrophil %: 82.3 %
Platelet: 261 10*3/uL (ref 150–440)
RBC: 4.53 10*6/uL (ref 3.80–5.20)
RDW: 12.8 % (ref 11.5–14.5)
WBC: 15 10*3/uL — AB (ref 3.6–11.0)

## 2013-06-20 LAB — COMPREHENSIVE METABOLIC PANEL
ALK PHOS: 128 U/L — AB
ALT: 16 U/L (ref 12–78)
Albumin: 3.7 g/dL (ref 3.4–5.0)
Anion Gap: 10 (ref 7–16)
BUN: 11 mg/dL (ref 7–18)
Bilirubin,Total: 0.5 mg/dL (ref 0.2–1.0)
CHLORIDE: 106 mmol/L (ref 98–107)
CO2: 22 mmol/L (ref 21–32)
CREATININE: 0.45 mg/dL — AB (ref 0.60–1.30)
Calcium, Total: 9.3 mg/dL (ref 8.5–10.1)
EGFR (African American): >60
EGFR (Non-African Amer.): >60
GLUCOSE: 161 mg/dL — AB (ref 65–99)
OSMOLALITY: 279 (ref 275–301)
POTASSIUM: 4.7 mmol/L (ref 3.5–5.1)
SGOT(AST): 28 U/L (ref 15–37)
Sodium: 138 mmol/L (ref 136–145)
Total Protein: 8.3 g/dL — ABNORMAL HIGH (ref 6.4–8.2)

## 2013-06-20 LAB — TROPONIN I: Troponin-I: 0.16 ng/mL — ABNORMAL HIGH

## 2013-06-20 LAB — PROTIME-INR
INR: 0.9
Prothrombin Time: 12.2 secs (ref 11.5–14.7)

## 2013-06-20 LAB — CK-MB: CK-MB: 1.2 ng/mL (ref 0.5–3.6)

## 2018-03-16 ENCOUNTER — Encounter: Payer: Self-pay | Admitting: Emergency Medicine

## 2018-03-16 ENCOUNTER — Encounter
Admission: EM | Disposition: A | Discharge status: Home / Self Care | Payer: Self-pay | Source: Home / Self Care | Attending: Family Medicine

## 2018-03-16 ENCOUNTER — Inpatient Hospital Stay
Admission: EM | Admit: 2018-03-16 | Discharge: 2018-03-19 | DRG: 247 | Disposition: A | Discharge status: Home / Self Care | Payer: Medicaid Other | Attending: Family Medicine | Admitting: Family Medicine

## 2018-03-16 ENCOUNTER — Other Ambulatory Visit: Payer: Self-pay

## 2018-03-16 ENCOUNTER — Emergency Department: Payer: Medicaid Other

## 2018-03-16 DIAGNOSIS — J449 Chronic obstructive pulmonary disease, unspecified: Secondary | ICD-10-CM | POA: Diagnosis present

## 2018-03-16 DIAGNOSIS — E785 Hyperlipidemia, unspecified: Secondary | ICD-10-CM | POA: Diagnosis present

## 2018-03-16 DIAGNOSIS — Z7901 Long term (current) use of anticoagulants: Secondary | ICD-10-CM | POA: Diagnosis not present

## 2018-03-16 DIAGNOSIS — Z8673 Personal history of transient ischemic attack (TIA), and cerebral infarction without residual deficits: Secondary | ICD-10-CM

## 2018-03-16 DIAGNOSIS — I213 ST elevation (STEMI) myocardial infarction of unspecified site: Secondary | ICD-10-CM

## 2018-03-16 DIAGNOSIS — F17210 Nicotine dependence, cigarettes, uncomplicated: Secondary | ICD-10-CM | POA: Diagnosis present

## 2018-03-16 DIAGNOSIS — I10 Essential (primary) hypertension: Secondary | ICD-10-CM

## 2018-03-16 DIAGNOSIS — I110 Hypertensive heart disease with heart failure: Secondary | ICD-10-CM | POA: Diagnosis present

## 2018-03-16 DIAGNOSIS — I255 Ischemic cardiomyopathy: Secondary | ICD-10-CM

## 2018-03-16 DIAGNOSIS — Z7902 Long term (current) use of antithrombotics/antiplatelets: Secondary | ICD-10-CM | POA: Diagnosis not present

## 2018-03-16 DIAGNOSIS — I5022 Chronic systolic (congestive) heart failure: Secondary | ICD-10-CM | POA: Diagnosis present

## 2018-03-16 DIAGNOSIS — R231 Pallor: Secondary | ICD-10-CM | POA: Diagnosis not present

## 2018-03-16 DIAGNOSIS — R631 Polydipsia: Secondary | ICD-10-CM | POA: Diagnosis not present

## 2018-03-16 DIAGNOSIS — Z955 Presence of coronary angioplasty implant and graft: Secondary | ICD-10-CM

## 2018-03-16 DIAGNOSIS — R079 Chest pain, unspecified: Secondary | ICD-10-CM | POA: Diagnosis present

## 2018-03-16 DIAGNOSIS — I420 Dilated cardiomyopathy: Secondary | ICD-10-CM | POA: Diagnosis present

## 2018-03-16 DIAGNOSIS — Z9119 Patient's noncompliance with other medical treatment and regimen: Secondary | ICD-10-CM

## 2018-03-16 DIAGNOSIS — E890 Postprocedural hypothyroidism: Secondary | ICD-10-CM | POA: Diagnosis present

## 2018-03-16 DIAGNOSIS — I2510 Atherosclerotic heart disease of native coronary artery without angina pectoris: Secondary | ICD-10-CM | POA: Diagnosis present

## 2018-03-16 DIAGNOSIS — I252 Old myocardial infarction: Secondary | ICD-10-CM

## 2018-03-16 DIAGNOSIS — I2119 ST elevation (STEMI) myocardial infarction involving other coronary artery of inferior wall: Secondary | ICD-10-CM | POA: Diagnosis present

## 2018-03-16 DIAGNOSIS — Z79899 Other long term (current) drug therapy: Secondary | ICD-10-CM | POA: Diagnosis not present

## 2018-03-16 HISTORY — DX: ST elevation (STEMI) myocardial infarction involving other coronary artery of inferior wall: I21.19

## 2018-03-16 HISTORY — PX: LEFT HEART CATH AND CORONARY ANGIOGRAPHY: CATH118249

## 2018-03-16 HISTORY — PX: CORONARY/GRAFT ACUTE MI REVASCULARIZATION: CATH118305

## 2018-03-16 LAB — LIPID PANEL
Cholesterol: 203 mg/dL — ABNORMAL HIGH (ref 0–200)
HDL: 49 mg/dL (ref >40)
LDL Cholesterol: 120 mg/dL — ABNORMAL HIGH (ref 0–99)
Total CHOL/HDL Ratio: 4.1 RATIO
Triglycerides: 168 mg/dL — ABNORMAL HIGH (ref <150)
VLDL: 34 mg/dL (ref 0–40)

## 2018-03-16 LAB — HEMOGLOBIN A1C
Hgb A1c MFr Bld: 5 % (ref 4.8–5.6)
Mean Plasma Glucose: 96.8 mg/dL

## 2018-03-16 LAB — COMPREHENSIVE METABOLIC PANEL
ALT: 16 U/L (ref 0–44)
AST: 29 U/L (ref 15–41)
Albumin: 4.3 g/dL (ref 3.5–5.0)
Alkaline Phosphatase: 113 U/L (ref 38–126)
Anion gap: 10 (ref 5–15)
BUN: 7 mg/dL (ref 6–20)
CO2: 23 mmol/L (ref 22–32)
Calcium: 9.3 mg/dL (ref 8.9–10.3)
Chloride: 109 mmol/L (ref 98–111)
Creatinine, Ser: 0.59 mg/dL (ref 0.44–1.00)
GFR calc Af Amer: >60 mL/min (ref >60)
GFR calc non Af Amer: >60 mL/min (ref >60)
Glucose, Bld: 108 mg/dL — ABNORMAL HIGH (ref 70–99)
Potassium: 3.2 mmol/L — ABNORMAL LOW (ref 3.5–5.1)
Sodium: 142 mmol/L (ref 135–145)
Total Bilirubin: 0.8 mg/dL (ref 0.3–1.2)
Total Protein: 7.6 g/dL (ref 6.5–8.1)

## 2018-03-16 LAB — BASIC METABOLIC PANEL
Anion gap: 9 (ref 5–15)
BUN: 7 mg/dL (ref 6–20)
CALCIUM: 8.9 mg/dL (ref 8.9–10.3)
CO2: 23 mmol/L (ref 22–32)
Chloride: 107 mmol/L (ref 98–111)
Creatinine, Ser: 0.48 mg/dL (ref 0.44–1.00)
GFR calc Af Amer: >60 mL/min (ref >60)
GFR calc non Af Amer: >60 mL/min (ref >60)
Glucose, Bld: 173 mg/dL — ABNORMAL HIGH (ref 70–99)
Potassium: 3.7 mmol/L (ref 3.5–5.1)
Sodium: 139 mmol/L (ref 135–145)

## 2018-03-16 LAB — CBC WITH DIFFERENTIAL/PLATELET
Abs Immature Granulocytes: 0.02 10*3/uL (ref 0.00–0.07)
Basophils Absolute: 0.1 10*3/uL (ref 0.0–0.1)
Basophils Relative: 1 %
Eosinophils Absolute: 0.1 10*3/uL (ref 0.0–0.5)
Eosinophils Relative: 1 %
HCT: 48.3 % — ABNORMAL HIGH (ref 36.0–46.0)
Hemoglobin: 16.7 g/dL — ABNORMAL HIGH (ref 12.0–15.0)
IMMATURE GRANULOCYTES: 0 %
Lymphocytes Relative: 44 %
Lymphs Abs: 4.8 10*3/uL — ABNORMAL HIGH (ref 0.7–4.0)
MCH: 34.4 pg — ABNORMAL HIGH (ref 26.0–34.0)
MCHC: 34.6 g/dL (ref 30.0–36.0)
MCV: 99.4 fL (ref 80.0–100.0)
MONOS PCT: 6 %
Monocytes Absolute: 0.7 10*3/uL (ref 0.1–1.0)
NEUTROS PCT: 48 %
Neutro Abs: 5.2 10*3/uL (ref 1.7–7.7)
Platelets: 249 10*3/uL (ref 150–400)
RBC: 4.86 MIL/uL (ref 3.87–5.11)
RDW: 11.9 % (ref 11.5–15.5)
WBC: 10.8 10*3/uL — ABNORMAL HIGH (ref 4.0–10.5)
nRBC: 0 % (ref 0.0–0.2)

## 2018-03-16 LAB — APTT: aPTT: 30 seconds (ref 24–36)

## 2018-03-16 LAB — PHOSPHORUS: Phosphorus: 3.4 mg/dL (ref 2.5–4.6)

## 2018-03-16 LAB — POCT ACTIVATED CLOTTING TIME: Activated Clotting Time: 268 seconds

## 2018-03-16 LAB — PROTIME-INR
INR: 0.95
Prothrombin Time: 12.6 seconds (ref 11.4–15.2)

## 2018-03-16 LAB — TROPONIN I
Troponin I: 0.8 ng/mL (ref <0.03)
Troponin I: 20.25 ng/mL (ref <0.03)

## 2018-03-16 LAB — MRSA PCR SCREENING: MRSA by PCR: NEGATIVE

## 2018-03-16 LAB — MAGNESIUM: Magnesium: 2.1 mg/dL (ref 1.7–2.4)

## 2018-03-16 SURGERY — CORONARY/GRAFT ACUTE MI REVASCULARIZATION
Anesthesia: Moderate Sedation

## 2018-03-16 MED ORDER — CLOPIDOGREL BISULFATE 75 MG PO TABS: 75.0000 mg | ORAL_TABLET | Freq: Every day | ORAL | Status: DC

## 2018-03-16 MED ORDER — IOPAMIDOL (ISOVUE-300) INJECTION 61%
INTRAVENOUS | Status: DC | PRN
  Administered 2018-03-16: 280 mL via INTRA_ARTERIAL

## 2018-03-16 MED ORDER — ONDANSETRON HCL 4 MG/2ML IJ SOLN
INTRAMUSCULAR | Status: AC
  Administered 2018-03-16: 4 mg
  Filled 2018-03-16: qty 2

## 2018-03-16 MED ORDER — GABAPENTIN 300 MG PO CAPS
300.0000 mg | ORAL_CAPSULE | Freq: Two times a day (BID) | ORAL | Status: DC
  Administered 2018-03-16 – 2018-03-19 (×6): 300 mg via ORAL
  Filled 2018-03-16 (×6): qty 1

## 2018-03-16 MED ORDER — LISINOPRIL 5 MG PO TABS
5.0000 mg | ORAL_TABLET | Freq: Every day | ORAL | Status: DC
  Administered 2018-03-17: 5 mg via ORAL
  Filled 2018-03-16 (×3): qty 1

## 2018-03-16 MED ORDER — WARFARIN SODIUM 7.5 MG PO TABS
7.5000 mg | ORAL_TABLET | Freq: Once | ORAL | Status: DC
  Filled 2018-03-16: qty 1

## 2018-03-16 MED ORDER — NITROGLYCERIN 1 MG/10 ML FOR IR/CATH LAB
INTRA_ARTERIAL | Status: DC | PRN
  Administered 2018-03-16: 200 ug via INTRACORONARY

## 2018-03-16 MED ORDER — SODIUM CHLORIDE 0.9 % IV SOLN
INTRAVENOUS | Status: DC | PRN
  Administered 2018-03-16 (×2): 1.75 mg/kg/h via INTRAVENOUS

## 2018-03-16 MED ORDER — NITROGLYCERIN 5 MG/ML IV SOLN
INTRAVENOUS | Status: AC
  Filled 2018-03-16: qty 10

## 2018-03-16 MED ORDER — ASPIRIN 81 MG PO CHEW: 324.0000 mg | CHEWABLE_TABLET | Freq: Once | ORAL | Status: DC

## 2018-03-16 MED ORDER — SODIUM CHLORIDE 0.9 % IV SOLN: 250.0000 mL | INTRAVENOUS | Status: DC | PRN

## 2018-03-16 MED ORDER — TICAGRELOR 90 MG PO TABS
ORAL_TABLET | ORAL | Status: DC | PRN
  Administered 2018-03-16: 180 mg via ORAL

## 2018-03-16 MED ORDER — NITROGLYCERIN 0.4 MG SL SUBL
SUBLINGUAL_TABLET | SUBLINGUAL | Status: AC
  Administered 2018-03-16: 0.4 mg
  Filled 2018-03-16: qty 1

## 2018-03-16 MED ORDER — MORPHINE SULFATE (PF) 4 MG/ML IV SOLN
INTRAVENOUS | Status: AC
  Administered 2018-03-16: 4 mg
  Filled 2018-03-16: qty 1

## 2018-03-16 MED ORDER — HEPARIN (PORCINE) IN NACL 1000-0.9 UT/500ML-% IV SOLN
INTRAVENOUS | Status: DC | PRN
  Administered 2018-03-16: 1000 mL

## 2018-03-16 MED ORDER — BIVALIRUDIN TRIFLUOROACETATE 250 MG IV SOLR
INTRAVENOUS | Status: AC
  Filled 2018-03-16: qty 250

## 2018-03-16 MED ORDER — TIROFIBAN HCL IN NACL 5-0.9 MG/100ML-% IV SOLN
INTRAVENOUS | Status: AC | PRN
  Administered 2018-03-16: 0.15 ug/kg/min via INTRAVENOUS

## 2018-03-16 MED ORDER — METOPROLOL SUCCINATE ER 25 MG PO TB24
25.0000 mg | ORAL_TABLET | Freq: Every day | ORAL | Status: DC
  Administered 2018-03-17 – 2018-03-18 (×2): 25 mg via ORAL
  Filled 2018-03-16 (×3): qty 1

## 2018-03-16 MED ORDER — ONDANSETRON HCL 4 MG/2ML IJ SOLN
INTRAMUSCULAR | Status: DC | PRN
  Administered 2018-03-16: 4 mg via INTRAVENOUS

## 2018-03-16 MED ORDER — ACETAMINOPHEN 325 MG PO TABS: 650.0000 mg | ORAL_TABLET | ORAL | Status: DC | PRN

## 2018-03-16 MED ORDER — BIVALIRUDIN BOLUS VIA INFUSION - CUPID
INTRAVENOUS | Status: DC | PRN
  Administered 2018-03-16: 56.1 mg via INTRAVENOUS

## 2018-03-16 MED ORDER — TIROFIBAN HCL IN NACL 5-0.9 MG/100ML-% IV SOLN
INTRAVENOUS | Status: AC
  Filled 2018-03-16: qty 100

## 2018-03-16 MED ORDER — SODIUM CHLORIDE 0.9 % WEIGHT BASED INFUSION: 1.0000 mL/kg/h | INTRAVENOUS | Status: AC

## 2018-03-16 MED ORDER — SPIRONOLACTONE 25 MG PO TABS
25.0000 mg | ORAL_TABLET | Freq: Every day | ORAL | Status: DC
  Administered 2018-03-17 – 2018-03-19 (×2): 25 mg via ORAL
  Filled 2018-03-16 (×3): qty 1

## 2018-03-16 MED ORDER — ONDANSETRON HCL 4 MG/2ML IJ SOLN
INTRAMUSCULAR | Status: AC
  Filled 2018-03-16: qty 2

## 2018-03-16 MED ORDER — TIROFIBAN HCL IN NACL 5-0.9 MG/100ML-% IV SOLN
0.1500 ug/kg/min | INTRAVENOUS | Status: AC
  Administered 2018-03-16 – 2018-03-17 (×2): 0.15 ug/kg/min via INTRAVENOUS
  Filled 2018-03-16 (×3): qty 100

## 2018-03-16 MED ORDER — LABETALOL HCL 5 MG/ML IV SOLN: 10.0000 mg | INTRAVENOUS | Status: AC | PRN

## 2018-03-16 MED ORDER — HYDRALAZINE HCL 20 MG/ML IJ SOLN: 5.0000 mg | INTRAMUSCULAR | Status: AC | PRN

## 2018-03-16 MED ORDER — SODIUM CHLORIDE 0.9 % IV SOLN: INTRAVENOUS | Status: DC

## 2018-03-16 MED ORDER — POTASSIUM CHLORIDE CRYS ER 20 MEQ PO TBCR
40.0000 meq | EXTENDED_RELEASE_TABLET | Freq: Once | ORAL | Status: AC
  Administered 2018-03-16: 40 meq via ORAL
  Filled 2018-03-16: qty 2

## 2018-03-16 MED ORDER — ASPIRIN 81 MG PO CHEW
81.0000 mg | CHEWABLE_TABLET | Freq: Every day | ORAL | Status: DC
  Administered 2018-03-17 – 2018-03-19 (×3): 81 mg via ORAL
  Filled 2018-03-16 (×3): qty 1

## 2018-03-16 MED ORDER — ORAL CARE MOUTH RINSE
15.0000 mL | Freq: Two times a day (BID) | OROMUCOSAL | Status: DC
  Administered 2018-03-18 (×2): 15 mL via OROMUCOSAL

## 2018-03-16 MED ORDER — SIMVASTATIN 20 MG PO TABS
40.0000 mg | ORAL_TABLET | Freq: Every day | ORAL | Status: DC
  Administered 2018-03-17 – 2018-03-18 (×2): 40 mg via ORAL
  Filled 2018-03-16 (×2): qty 2

## 2018-03-16 MED ORDER — TICAGRELOR 90 MG PO TABS
ORAL_TABLET | ORAL | Status: AC
  Filled 2018-03-16: qty 2

## 2018-03-16 MED ORDER — TICAGRELOR 90 MG PO TABS: 90.0000 mg | ORAL_TABLET | Freq: Two times a day (BID) | ORAL | Status: DC

## 2018-03-16 MED ORDER — TIROFIBAN (AGGRASTAT) BOLUS VIA INFUSION
INTRAVENOUS | Status: DC | PRN
  Administered 2018-03-16: 1870 ug via INTRAVENOUS

## 2018-03-16 MED ORDER — NITROGLYCERIN 0.4 MG SL SUBL: 0.4000 mg | SUBLINGUAL_TABLET | SUBLINGUAL | Status: DC | PRN

## 2018-03-16 MED ORDER — SODIUM CHLORIDE 0.9% FLUSH
3.0000 mL | Freq: Two times a day (BID) | INTRAVENOUS | Status: DC
  Administered 2018-03-17 – 2018-03-18 (×3): 3 mL via INTRAVENOUS

## 2018-03-16 MED ORDER — SODIUM CHLORIDE 0.9% FLUSH: 3.0000 mL | INTRAVENOUS | Status: DC | PRN

## 2018-03-16 MED ORDER — ONDANSETRON HCL 4 MG/2ML IJ SOLN: 4.0000 mg | Freq: Four times a day (QID) | INTRAMUSCULAR | Status: DC | PRN

## 2018-03-16 MED ORDER — WARFARIN - PHARMACIST DOSING INPATIENT
Freq: Every day | Status: DC
  Administered 2018-03-17: 18:00:00

## 2018-03-16 SURGICAL SUPPLY — 20 items
BALLN TREK RX 2.25X15 (BALLOONS) ×3
BALLN ~~LOC~~ EUPHORA RX 2.5X15 (BALLOONS) ×3
BALLOON TREK RX 2.25X15 (BALLOONS) ×1 IMPLANT
BALLOON ~~LOC~~ EUPHORA RX 2.5X15 (BALLOONS) ×1 IMPLANT
CATH INFINITI 5FR ANG PIGTAIL (CATHETERS) ×3 IMPLANT
CATH INFINITI 5FR JL4 (CATHETERS) ×3 IMPLANT
CATH VISTA GUIDE 6FR JR4 SH (CATHETERS) ×3 IMPLANT
DEVICE CLOSURE MYNXGRIP 6/7F (Vascular Products) ×3 IMPLANT
DEVICE INFLAT 30 PLUS (MISCELLANEOUS) ×3 IMPLANT
GUIDELINER 6F (CATHETERS) ×3 IMPLANT
KIT MANI 3VAL PERCEP (MISCELLANEOUS) ×3 IMPLANT
NEEDLE PERC 18GX7CM (NEEDLE) ×3 IMPLANT
PACK CARDIAC CATH (CUSTOM PROCEDURE TRAY) ×3 IMPLANT
SHEATH AVANTI 6FR X 11CM (SHEATH) ×3 IMPLANT
STENT RESOLUTE ONYX 2.25X18 (Permanent Stent) ×3 IMPLANT
STENT RESOLUTE ONYX 2.5X22 (Permanent Stent) ×3 IMPLANT
STENT RESOLUTE ONYX 2.75X15 (Permanent Stent) ×3 IMPLANT
WIRE ASAHI PROWATER 180CM (WIRE) ×3 IMPLANT
WIRE G HI TQ BMW 190 (WIRE) ×3 IMPLANT
WIRE GUIDERIGHT .035X150 (WIRE) ×6 IMPLANT

## 2018-03-16 NOTE — ED Notes (Signed)
Dr Saralyn Pilar to bedside

## 2018-03-16 NOTE — ED Triage Notes (Signed)
Pt to ED with c/o of chest pain that started last night. Pt states N/V and dizziness. Pt has hx of 2 MI's and stent placement.

## 2018-03-16 NOTE — ED Provider Notes (Signed)
[facility] Emergency Department Provider Note ____________________________________________   First MD Initiated Contact with Patient 03/16/18 1731     (approximate)  I have reviewed the triage vital signs and the nursing notes.   HISTORY  Chief Complaint No chief complaint on file.    HPI Alyla Pietila Seydel is a 58 y.o. female with PMH as noted below including prior MI status post stenting who presents with chest pain, gradual onset last night, persistent course since then, substernal, and associated with nausea and some shortness of breath as well as lightheadedness.  EMS EKG showed findings concerning for ST elevation MI.  Past Medical History:  Diagnosis Date  . Asthma   . Cancer ([place])   . Chronic systolic heart failure (HCC)    Due to postinfarct cardiomyopathy. Ejection fraction was 30% in 2010 with anterior wall akinesis  . Coronary artery disease 05/2008   Acute anterior ST elevation myocardial infarction . Delayed intervention at [facility] due to mental status changes and suspected stroke. MRI showed old strokes. Cardiac cath showed an occluded mid LAD with 50% mid RCA stenosis and mild left circumflex disease. She had thrombectomy and bare-metal stent placement to the mid LAD (2.75 x 24 mm). Ejection fraction was 25%  . Hyperlipidemia   . Hypertension   . MI (myocardial infarction) ([place])   . Stroke [facility])    Noted on MRI in 2010  . Tobacco abuse     Patient Active Problem List   Diagnosis Date Noted  . Apical mural thrombus following MI ([place]) 01/04/2012  . Coronary artery disease   . Hyperlipidemia   . Chronic systolic heart failure ([place])   . Tobacco abuse     Past Surgical History:  Procedure Laterality Date  . BACK SURGERY    . CARDIAC CATHETERIZATION  2010   [facility]  . CORONARY ANGIOPLASTY    . THROAT SURGERY    . THYROIDECTOMY      Prior to Admission medications   Medication Sig Start Date End Date Taking? Authorizing Provider    clopidogrel (PLAVIX) 75 MG tablet Take 1 tablet (75 mg total) by mouth daily. 12/28/11   Wellington Hampshire, MD  gabapentin (NEURONTIN) 300 MG capsule Takes 1-2 tablets daily at bedtime.    [provider]  lisinopril (PRINIVIL,ZESTRIL) 5 MG tablet Take 1 tablet (5 mg total) by mouth daily. 01/04/12   Wellington Hampshire, MD  metoprolol succinate (TOPROL XL) 25 MG 24 hr tablet Take 1 tablet (25 mg total) by mouth daily. 12/28/11   Wellington Hampshire, MD  nitroGLYCERIN (NITROSTAT) 0.4 MG SL tablet Place 0.4 mg under the tongue every 5 (five) minutes as needed.    [provider]  simvastatin (ZOCOR) 40 MG tablet Take 1 tablet (40 mg total) by mouth daily. 12/28/11   Wellington Hampshire, MD  spironolactone (ALDACTONE) 25 MG tablet Take 1 tablet (25 mg total) by mouth daily. 01/04/12   Wellington Hampshire, MD  warfarin (COUMADIN) 5 MG tablet Take 1 tablet (5 mg total) by mouth at bedtime. 01/04/12   Wellington Hampshire, MD    Allergies Patient has no known allergies.  History reviewed. No pertinent family history.  Social History Social History   Tobacco Use  . Smoking status: Current Every Day Smoker    Packs/day: 0.25    Years: 25.00    Pack years: 6.25    Types: Cigarettes  . Smokeless tobacco: Never Used  Substance Use Topics  . Alcohol  use: No  . Drug use: No    Review of Systems  Constitutional: No fever. Eyes: No redness. ENT: No neck pain. Cardiovascular: Positive for chest pain. Respiratory: Positive for shortness of breath. Gastrointestinal: Positive for nausea.  Genitourinary: Negative for flank pain.  Musculoskeletal: Negative for back pain. Skin: Negative for rash. Neurological: Negative for headache.   ____________________________________________   PHYSICAL EXAM:  VITAL SIGNS: ED Triage Vitals  Enc Vitals Group     BP --      Pulse Rate 03/16/18 1721 72     Resp 03/16/18 1721 12     Temp 03/16/18 1721 (!) 97.5 F (36.4 C)     Temp Source  03/16/18 1721 Oral     SpO2 03/16/18 1728 95 %     Weight 03/16/18 1724 165 lb (74.8 kg)     Height 03/16/18 1724 5\' 2"  (1.575 m)     Head Circumference --      Peak Flow --      Pain Score 03/16/18 1723 6     Pain Loc --      Pain Edu? --      Excl. in [place]? --     Constitutional: Alert and oriented. Well appearing and in no acute distress. Eyes: Conjunctivae are normal.  Head: Atraumatic. Nose: No congestion/rhinnorhea. Mouth/Throat: Mucous membranes are moist.   Neck: Normal range of motion.  Cardiovascular: Normal rate, regular rhythm. Grossly normal heart sounds.  Good peripheral circulation. Respiratory: Normal respiratory effort.  No retractions. Lungs CTAB. Gastrointestinal: No distention.  Musculoskeletal: Extremities warm and well perfused.  Neurologic:  Normal speech and language. No gross focal neurologic deficits are appreciated.  Skin:  Skin is warm and dry. No rash noted. Psychiatric: Mood and affect are normal. Speech and behavior are normal.  ____________________________________________   LABS (all labs ordered are listed, but only abnormal results are displayed)  Labs Reviewed  CBC WITH DIFFERENTIAL/PLATELET - Abnormal; Notable for the following components:      Result Value   WBC 10.8 (*)    Hemoglobin 16.7 (*)    HCT 48.3 (*)    MCH 34.4 (*)    Lymphs Abs 4.8 (*)    All other components within normal limits  PROTIME-INR  APTT  COMPREHENSIVE METABOLIC PANEL  TROPONIN I  LIPID PANEL   ____________________________________________  EKG  ED ECG REPORT I, Arta Silence, the attending physician, personally viewed and interpreted this ECG.  Date: 03/16/2018 EKG Time: 1719 Rate: 80 Rhythm: normal sinus rhythm QRS Axis: normal Intervals: normal ST/T Wave abnormalities: Acute ST elevations in II, III and aVF Narrative Interpretation: Concerning for acute inferior  MI  ____________________________________________  RADIOLOGY    ____________________________________________   PROCEDURES  Procedure(s) performed: No  Procedures  Critical Care performed: Yes  CRITICAL CARE Performed by: Arta Silence   Total critical care time: 30 minutes  Critical care time was exclusive of separately billable procedures and treating other patients.  Critical care was necessary to treat or prevent imminent or life-threatening deterioration.  Critical care was time spent personally by me on the following activities: development of treatment plan with patient and/or surrogate as well as nursing, discussions with consultants, evaluation of patient's response to treatment, examination of patient, obtaining history from patient or surrogate, ordering and performing treatments and interventions, ordering and review of laboratory studies, ordering and review of radiographic studies, pulse oximetry and re-evaluation of patient's condition. ____________________________________________   INITIAL IMPRESSION / ASSESSMENT AND PLAN / ED COURSE  Pertinent labs & imaging results that were available during my care of the patient were reviewed by me and considered in my medical decision making (see chart for details).  58 year old female with a history of CAD and previous MI, most recently in 2015 presents with chest pain since last night.  EMS EKG is consistent with ST elevation MI.  I reviewed the patient's past medical records in epic.  She was most recently admitted at [facility] in 2015.  She had a similar EKG with inferior ST elevations at that time and did have an acute lesion which was stented.  STEMI team was activated prior to the patient's arrival.  Dr. Saralyn Pilar was called back at 528 p.m. and I discussed the case with him and reviewed the EKG.  He subsequently came to evaluate the patient at the bedside and took her to the Cath Lab.  The patient remained  hemodynamically stable throughout her ED stay.  ____________________________________________   FINAL CLINICAL IMPRESSION(S) / ED DIAGNOSES  Final diagnoses:  ST elevation myocardial infarction (STEMI), unspecified artery ([place])      NEW MEDICATIONS STARTED DURING THIS VISIT:  Current Discharge Medication List       Note:  This document was prepared using Dragon voice recognition software and may include unintentional dictation errors.    Arta Silence, MD 03/16/18 [phone]

## 2018-03-16 NOTE — Consult Note (Signed)
PULMONARY / CRITICAL CARE MEDICINE  Name: Brianna Roth MRN: [id number] DOB: 1960/04/26    LOS: 0  Referring Provider:  Dr. Saralyn Pilar Reason for Referral: STEMI  HPI: This is a 58 y/o female with a known h/o CAD with a prior LAD stent who presented to the ED with chest pain, nausea, vomiting and dizziness. Her EKG showed ST elevations in leads II, III and AVF. She was taken to the cath lab and found to have a complete occlusion of the RCA. A drug eluding stent was placed. She is being admitted to the ICU for post-procedure monitoring. She denies chest pain and dizziness but reports extreme thirst. She is requesting ice water. Right femoral site with intact pressure dressing. Mild right leg discoloration. She denies leg pain and numbness.   Past Medical History:  Diagnosis Date  . Asthma   . Cancer ([place])   . Chronic systolic heart failure (HCC)    Due to postinfarct cardiomyopathy. Ejection fraction was 30% in 2010 with anterior wall akinesis  . Coronary artery disease 05/2008   Acute anterior ST elevation myocardial infarction . Delayed intervention at [facility] due to mental status changes and suspected stroke. MRI showed old strokes. Cardiac cath showed an occluded mid LAD with 50% mid RCA stenosis and mild left circumflex disease. She had thrombectomy and bare-metal stent placement to the mid LAD (2.75 x 24 mm). Ejection fraction was 25%  . Hyperlipidemia   . Hypertension   . MI (myocardial infarction) ([place])   . Stroke [facility])    Noted on MRI in 2010  . Tobacco abuse    Past Surgical History:  Procedure Laterality Date  . BACK SURGERY    . CARDIAC CATHETERIZATION  2010   [facility]  . CORONARY ANGIOPLASTY    . THROAT SURGERY    . THYROIDECTOMY     Prior to Admission medications   Medication Sig Start Date End Date Taking? Authorizing Provider  amLODipine (NORVASC) 5 MG tablet Take 5 mg by mouth daily.   Yes [provider]  clopidogrel (PLAVIX) 75 MG tablet Take 75 mg by  mouth daily.   Yes [provider]  donepezil (ARICEPT) 5 MG tablet Take 1 tablet (5 mg total) by mouth at bedtime. 10/07/17 11/16/17 Yes Sowles, Drue Stager, MD  empagliflozin (JARDIANCE) 25 MG TABS tablet Take 25 mg by mouth daily.   Yes [provider]  glycopyrrolate (ROBINUL) 1 MG tablet Take 1 mg by mouth 2 (two) times daily.   Yes [provider]  insulin aspart (NOVOLOG FLEXPEN) 100 UNIT/ML FlexPen Inject 12 Units into the skin 2 (two) times daily.   Yes [provider]  insulin aspart (NOVOLOG) 100 UNIT/ML FlexPen Inject 18 Units into the skin daily. At 1700   Yes [provider]  Insulin Degludec-Liraglutide (XULTOPHY) 100-3.6 UNIT-MG/ML SOPN Inject 50 Units into the skin daily.   Yes [provider]  levETIRAcetam (KEPPRA) 500 MG tablet Take 500 mg by mouth 2 (two) times daily.   Yes [provider]  lipase/protease/amylase (CREON) 12000 units CPEP capsule Take 6,000 Units by mouth 3 (three) times daily before meals.   Yes [provider]  lipase/protease/amylase (CREON) 12000 units CPEP capsule Take 3,000 Units by mouth at bedtime. With snack   Yes [provider]  lisinopril (PRINIVIL,ZESTRIL) 5 MG tablet Take 5 mg by mouth daily.   Yes [provider]  metoprolol succinate (TOPROL-XL) 25 MG 24 hr tablet Take 1 tablet (25 mg total) by mouth  daily. 10/07/17  Yes Sowles, Drue Stager, MD  rosuvastatin (CRESTOR) 40 MG tablet Take 1 tablet (40 mg total) by mouth daily. 10/07/17 11/16/17 Yes Steele Sizer, MD  aspirin EC 81 MG tablet Take 81 mg by mouth daily.    [provider]  famotidine (PEPCID) 20 MG tablet Take 1 tablet (20 mg total) by mouth 2 (two) times daily. 10/07/17 11/06/17  Steele Sizer, MD  gabapentin (NEURONTIN) 300 MG capsule Take 1 capsule (300 mg total) by mouth 2 (two) times daily. 10/07/17 11/06/17  Steele Sizer, MD  insulin glargine (LANTUS) 100 UNIT/ML injection Inject 0.1 mLs (10  Units total) into the skin daily. 10/07/17 11/06/17  Steele Sizer, MD  lacosamide 100 MG TABS Take 1 tablet (100 mg total) by mouth 2 (two) times daily. Patient not taking: Reported on 11/16/2017 02/22/17   Fritzi Mandes, MD  promethazine (PHENERGAN) 12.5 MG tablet Take 1 tablet (12.5 mg total) by mouth every 6 (six) hours as needed for nausea or vomiting. Patient not taking: Reported on 11/16/2017 12/11/16   Stark Klein, MD  sertraline (ZOLOFT) 25 MG tablet Take 1 tablet (25 mg total) by mouth daily. Patient not taking: Reported on 11/16/2017 10/07/17   Steele Sizer, MD   Allergies No Known Allergies  Family History History reviewed. No pertinent family history. Social History  reports that she has been smoking cigarettes. She has a 6.25 pack-year smoking history. She has never used smokeless tobacco. She reports that she does not drink alcohol or use drugs.  Review Of Systems:   Constitutional: Negative for fever and chills.  HENT: Negative for congestion and rhinorrhea.  Eyes: Negative for redness and visual disturbance.  Respiratory: Negative for shortness of breath and wheezing.  Cardiovascular: Positive for chest pain that has resolved Gastrointestinal: nausea , and  vomiting resolved. Denies  abdominal pain and  Loose stools Genitourinary: Negative for dysuria and urgency.  Endocrine: Denies polyuria, polyphagia and heat intolerance Musculoskeletal: Negative for myalgias and arthralgias.  Skin: Negative for pallor and wound.  Neurological: Dizziness has resolved . denies headaches   VITAL SIGNS: BP 113/90   Pulse 83   Temp (!) 97.5 F (36.4 C) (Oral)   Resp 13   Ht 5\' 2"  (1.575 m)   Wt 74.8 kg   SpO2 97%   BMI 30.18 kg/m   HEMODYNAMICS:    VENTILATOR SETTINGS:    INTAKE / OUTPUT: No intake/output data recorded.  PHYSICAL EXAMINATION: General:  NAD HEENT:  PERRLA, trachea midline, no JVD Neuro:  AAO X4, no focal deficits Cardiovascular:  AP regular, S1/S2,  no MRG, + 2 pulses, right leg with mild discoloration from the thigh to the shin, intact pulses Lungs:  CTAB Abdomen:  NT/ND, + BS X4 Musculoskeletal: +ROM, no deformities Skin: warm and dry  LABS:  BMET Recent Labs  Lab 03/16/18 1726  NA 142  K 3.2*  CL 109  CO2 23  BUN 7  CREATININE 0.59  GLUCOSE 108*    Electrolytes Recent Labs  Lab 03/16/18 1726  CALCIUM 9.3    CBC Recent Labs  Lab 03/16/18 1726  WBC 10.8*  HGB 16.7*  HCT 48.3*  PLT 249    Coag's Recent Labs  Lab 03/16/18 1726  APTT 30  INR 0.95    Sepsis Markers No results for input(s): LATICACIDVEN, PROCALCITON, O2SATVEN in the last 168 hours.  ABG No results for input(s): PHART, PCO2ART, PO2ART in the last 168 hours.  Liver Enzymes Recent Labs  Lab 03/16/18 1726  AST 29  ALT 16  ALKPHOS 113  BILITOT 0.8  ALBUMIN 4.3    Cardiac Enzymes Recent Labs  Lab 03/16/18 1726  TROPONINI 0.80*    Glucose No results for input(s): GLUCAP in the last 168 hours.  Imaging Dg Chest Port 1 View  Result Date: 03/16/2018 CLINICAL DATA:  Chest pain and myocardial infarction. EXAM: PORTABLE CHEST 1 VIEW COMPARISON:  09/07/2005 radiograph FINDINGS: Cardiomegaly and mild pulmonary vascular congestion noted. There is no evidence of focal airspace disease, pulmonary edema, suspicious pulmonary nodule/mass, pleural effusion, or pneumothorax. No acute bony abnormalities are identified. IMPRESSION: Cardiomegaly with mild pulmonary vascular congestion. Electronically Signed   By: Margarette Canada M.D.   On: 03/16/2018 17:47   STUDIES:  2 D ECHO   SIGNIFICANT EVENTS: 03/16/2018  LINES/TUBES: PIVs  DISCUSSION: 58 y/o female, current smoker and known CAD S/P prior MI with stent placement presenting with an acute inferior wall MI; now s/p LHC with stent to the RCA. Doing well post-procedure. Patient has been moving her right leg despite being told to lie still.  ASSESSMENT  Acute inferior wall MI s/p LHC with  stent to the RCA CAD s/p anterior wall MI with bare metal stent to the LAD Tobacco use disorder Ischemic cardiomyopathy with LVEF 25-35% COPD Hypertension  Hyperlipidemia  PLAN Treatment plan per cardiology 2-D echo in am Hemodynamic monitoring per ICU protocol Monitor and correct electrolytes Resume all home medications  Best Practice: Code Status:Full code Diet: heart healthy GI prophylaxis: not indicated VTE prophylaxis:  Already on coumadin  FAMILY  - Updates: patient updated on current treatment plan. No family at bedside  Aarav Burgett S. Tukov-Yual ANP-BC Pul[facility] [place] Pager [phone] or [phone]  NB: This document was prepared using Dragon voice recognition software and may include unintentional dictation errors.    03/16/2018, 9:46 PM

## 2018-03-16 NOTE — H&P (Signed)
[place] [facility] [place] NAME: Brianna Roth    MR#:  [id number]  DATE OF BIRTH:  08/19/1960  DATE OF ADMISSION:  03/16/2018  PRIMARY CARE PHYSICIAN: Rodman Key, MD   REQUESTING/REFERRING PHYSICIAN: dr Cherylann Banas  CHIEF COMPLAINT:   Chest pain HISTORY OF PRESENT ILLNESS:  Brianna Roth  is a 58 y.o. female with a known history of chronic systolic heart failure, CAD and hypertension who presents to the ER due to chest pain. Patient reported that her chest pain started last night was associated with nausea and shortness of breath.  Pain currently is 6 out of 10.  Patient was found to have STEMI. She has been evaluated by cardiology and is being taken to cardiac Cath Lab for acute STEMI.  PAST MEDICAL HISTORY:   Past Medical History:  Diagnosis Date  . Asthma   . Cancer ([place])   . Chronic systolic heart failure (HCC)    Due to postinfarct cardiomyopathy. Ejection fraction was 30% in 2010 with anterior wall akinesis  . Coronary artery disease 05/2008   Acute anterior ST elevation myocardial infarction . Delayed intervention at [facility] due to mental status changes and suspected stroke. MRI showed old strokes. Cardiac cath showed an occluded mid LAD with 50% mid RCA stenosis and mild left circumflex disease. She had thrombectomy and bare-metal stent placement to the mid LAD (2.75 x 24 mm). Ejection fraction was 25%  . Hyperlipidemia   . Hypertension   . MI (myocardial infarction) ([place])   . Stroke [facility])    Noted on MRI in 2010  . Tobacco abuse     PAST SURGICAL HISTORY:   Past Surgical History:  Procedure Laterality Date  . BACK SURGERY    . CARDIAC CATHETERIZATION  2010   [facility]  . CORONARY ANGIOPLASTY    . THROAT SURGERY    . THYROIDECTOMY      SOCIAL HISTORY:   Social History   Tobacco Use  . Smoking status: Current Every Day Smoker    Packs/day: 0.25    Years: 25.00    Pack years: 6.25    Types: Cigarettes  . Smokeless tobacco: Never  Used  Substance Use Topics  . Alcohol use: No    FAMILY HISTORY:  History reviewed. No pertinent family history.  DRUG ALLERGIES:  No Known Allergies  REVIEW OF SYSTEMS:   Review of Systems  Constitutional: Negative.  Negative for chills, fever and malaise/fatigue.  HENT: Negative.  Negative for ear discharge, ear pain, hearing loss, nosebleeds and sore throat.   Eyes: Negative.  Negative for blurred vision and pain.  Respiratory: Negative.  Negative for cough, hemoptysis, shortness of breath and wheezing.   Cardiovascular: Positive for chest pain and palpitations. Negative for leg swelling.  Gastrointestinal: Positive for nausea. Negative for abdominal pain, blood in stool, diarrhea and vomiting.  Genitourinary: Negative.  Negative for dysuria.  Musculoskeletal: Negative.  Negative for back pain.  Skin: Negative.   Neurological: Negative for dizziness, tremors, speech change, focal weakness, seizures and headaches.  Endo/Heme/Allergies: Negative.  Does not bruise/bleed easily.  Psychiatric/Behavioral: Negative.  Negative for depression, hallucinations and suicidal ideas.    MEDICATIONS AT HOME:   Prior to Admission medications   Medication Sig Start Date End Date Taking? Authorizing Provider  clopidogrel (PLAVIX) 75 MG tablet Take 1 tablet (75 mg total) by mouth daily. 12/28/11   Wellington Hampshire, MD  gabapentin (NEURONTIN) 300 MG capsule Takes 1-2 tablets daily at bedtime.  [provider]  lisinopril (PRINIVIL,ZESTRIL) 5 MG tablet Take 1 tablet (5 mg total) by mouth daily. 01/04/12   Wellington Hampshire, MD  metoprolol succinate (TOPROL XL) 25 MG 24 hr tablet Take 1 tablet (25 mg total) by mouth daily. 12/28/11   Wellington Hampshire, MD  nitroGLYCERIN (NITROSTAT) 0.4 MG SL tablet Place 0.4 mg under the tongue every 5 (five) minutes as needed.    [provider]  simvastatin (ZOCOR) 40 MG tablet Take 1 tablet (40 mg total) by mouth daily. 12/28/11   Wellington Hampshire, MD  spironolactone (ALDACTONE) 25 MG tablet Take 1 tablet (25 mg total) by mouth daily. 01/04/12   Wellington Hampshire, MD  warfarin (COUMADIN) 5 MG tablet Take 1 tablet (5 mg total) by mouth at bedtime. 01/04/12   Wellington Hampshire, MD      VITAL SIGNS:  Blood pressure (!) 150/99, pulse 81, temperature (!) 97.5 F (36.4 C), temperature source Oral, resp. rate (!) 29, height 5\' 2"  (1.575 m), weight 74.8 kg, SpO2 92 %.  PHYSICAL EXAMINATION:   Physical Exam Constitutional:      General: She is not in acute distress. HENT:     Head: Normocephalic.  Eyes:     General: No scleral icterus. Neck:     Musculoskeletal: Normal range of motion and neck supple.     Vascular: No JVD.     Trachea: No tracheal deviation.  Cardiovascular:     Rate and Rhythm: Normal rate and regular rhythm.     Heart sounds: Murmur present. No friction rub. No gallop.   Pulmonary:     Effort: Pulmonary effort is normal. No respiratory distress.     Breath sounds: Normal breath sounds. No wheezing or rales.  Chest:     Chest wall: No tenderness.  Abdominal:     General: Bowel sounds are normal. There is no distension.     Palpations: Abdomen is soft. There is no mass.     Tenderness: There is no abdominal tenderness. There is no guarding or rebound.  Musculoskeletal: Normal range of motion.  Skin:    General: Skin is warm.     Findings: No erythema or rash.  Neurological:     Mental Status: She is alert and oriented to person, place, and time.  Psychiatric:        Judgment: Judgment normal.       LABORATORY PANEL:   CBC Recent Labs  Lab 03/16/18 1726  WBC 10.8*  HGB 16.7*  HCT 48.3*  PLT 249   ------------------------------------------------------------------------------------------------------------------  Chemistries  No results for input(s): NA, K, CL, CO2, GLUCOSE, BUN, CREATININE, CALCIUM, MG, AST, ALT, ALKPHOS, BILITOT in the last 168 hours.  Invalid input(s):  GFRCGP ------------------------------------------------------------------------------------------------------------------  Cardiac Enzymes No results for input(s): TROPONINI in the last 168 hours. ------------------------------------------------------------------------------------------------------------------  RADIOLOGY:  Dg Chest Port 1 View  Result Date: 03/16/2018 CLINICAL DATA:  Chest pain and myocardial infarction. EXAM: PORTABLE CHEST 1 VIEW COMPARISON:  09/07/2005 radiograph FINDINGS: Cardiomegaly and mild pulmonary vascular congestion noted. There is no evidence of focal airspace disease, pulmonary edema, suspicious pulmonary nodule/mass, pleural effusion, or pneumothorax. No acute bony abnormalities are identified. IMPRESSION: Cardiomegaly with mild pulmonary vascular congestion. Electronically Signed   By: Margarette Canada M.D.   On: 03/16/2018 17:47    EKG:   ST elevation in inferior leads II, III and aVF  IMPRESSION AND PLAN:   58 year old female with a history of CAD/stent who presents with chest pain  and found to have STEMI  1.  Inferior STEMI: Patient taken for cardiac catheterization emergently. Follow-up on cardiology recommendations and medications Continue beta-blocker, ACE inhibitor, statin Check lipid panel and A1c  2.  Essential hypertension: Continue lisinopril and metoprolol and adjust if needed  3.  Hyperlipidemia: Continue statin and check lipid panel  4.Tobacco dependence: Patient is encouraged to quit smoking. Counseling was provided for 4 minutes.   All the records are reviewed and case discussed with ED provider. Management plans discussed with the patient and she in agreement  CODE STATUS: full  TOTAL TIME TAKING CARE OF THIS PATIENT: 41 minutes.    Lealer Marsland M.D on 03/16/2018 at 6:19 PM  Between 7am to 6pm - Pager - [phone]  After 6pm go to [email or web address] - password [email or web address] [facility]  Office   [phone]  CC: Primary care physician; Rodman Key, MD

## 2018-03-16 NOTE — Progress Notes (Signed)
[facility] Progress Note Patient Name: Brianna Roth DOB: 12-26-1960 MRN: [id number]   Date of Service  03/16/2018  HPI/Events of Note  58 yo female with PMH of ischemic cardiomyopathy, with LVEF 25 to 35% with history of apical thrombus. Admitted with inferior ST elevation myocardial infarction. Now s/p cath lab. No cath lab note in Epic yet. PCCM asked to assume care. VSS.  eICU Interventions  No new orders.     Intervention Category Evaluation Type: New Patient Evaluation  Lysle Dingwall 03/16/2018, 8:47 PM

## 2018-03-16 NOTE — Consult Note (Addendum)
ANTICOAGULATION CONSULT NOTE - Initial Consult  Pharmacy Consult for Warfarin Indication: atrial fibrillation  No Known Allergies  Patient Measurements: Height: 5\' 2"  (157.5 cm) Weight: 165 lb (74.8 kg) IBW/kg (Calculated) : 50.1  Vital Signs: Temp: 97.5 F (36.4 C) (02/02 1721) Temp Source: Oral (02/02 1721) BP: 150/99 (02/02 1726) Pulse Rate: 81 (02/02 1749)  Labs: Recent Labs    03/16/18 1726  HGB 16.7*  HCT 48.3*  PLT 249  APTT 30  LABPROT 12.6  INR 0.95  CREATININE 0.59  TROPONINI 0.80*    Estimated Creatinine Clearance: 73.5 mL/min (by C-G formula based on SCr of 0.59 mg/dL).   Medical History: Past Medical History:  Diagnosis Date  . Asthma   . Cancer ([place])   . Chronic systolic heart failure (HCC)    Due to postinfarct cardiomyopathy. Ejection fraction was 30% in 2010 with anterior wall akinesis  . Coronary artery disease 05/2008   Acute anterior ST elevation myocardial infarction . Delayed intervention at [facility] due to mental status changes and suspected stroke. MRI showed old strokes. Cardiac cath showed an occluded mid LAD with 50% mid RCA stenosis and mild left circumflex disease. She had thrombectomy and bare-metal stent placement to the mid LAD (2.75 x 24 mm). Ejection fraction was 25%  . Hyperlipidemia   . Hypertension   . MI (myocardial infarction) ([place])   . Stroke [facility])    Noted on MRI in 2010  . Tobacco abuse     Medications:  Patient on Warfarin 5mg  daily prior to admission  Assessment: Pt is subtherapeutic for A fib with an INR of 0.95  Goal of Therapy:  INR 2-3 Monitor platelets by anticoagulation protocol: Yes   Plan:  Will dose 7.5mg  this evening (150% of home dose per protocol for subtherapeutic INR)  Will monitor INR/CBC's daily per protocol  Lu Duffel, PharmD, BCPS Clinical Pharmacist 03/16/2018 6:40 PM

## 2018-03-16 NOTE — Progress Notes (Signed)
Pastoral Care Visit    03/16/18 1715  Clinical Encounter Type  Visited With Patient;Patient and family together;Health care provider  Visit Type Initial;Code;ED  Referral From Nurse  Consult/Referral To Chaplain  Spiritual Encounters  Spiritual Needs Prayer  Stress Factors  Patient Stress Factors Health changes  Family Stress Factors Health changes   Pt came in under code stemi.  Chap attended patiently while medical team assessed and treated pt.  When pt's daughter and husband arrived, Melven Sartorius escorted them in to see pt.  Melven Sartorius was asked to pray.  Melven Sartorius prayed and let fam know that a chap is available for spiritual/emotional support 24/7.  Darcey Nora, Chaplain

## 2018-03-16 NOTE — Consult Note (Signed)
[facility] Cardiology  CARDIOLOGY CONSULT NOTE  Patient ID: Brianna Roth MRN: [id number] DOB/AGE: July 15, 1960 58 y.o.  Admit date: 03/16/2018 Referring Physician [facility] Primary Physician  Primary Cardiologist  Reason for Consultation inferior STEMI  HPI: Patient referred for inferior ST elevation myocardial infarction.  Patient was in use of health until last evening, when she developed substernal chest discomfort which persisted throughout today.  Members urged her to to [facility] emergency room where ECG revealed nonstick ST elevation in leads II, III and aVF consistent with inferior wall ST elevation myocardial infarction.  She has known coronary artery disease, with history of prior anterior myocardial infarction, status post bare-metal stents mid LAD, and inferior ST elevation myocardial infarction, with drug-eluting stent in distal RCA.  Patient has known ischemic cardiomyopathy, with LVEF 25 to 35% with history of apical thrombus.  Review of systems complete and found to be negative unless listed above     Past Medical History:  Diagnosis Date  . Asthma   . Cancer ([place])   . Chronic systolic heart failure (HCC)    Due to postinfarct cardiomyopathy. Ejection fraction was 30% in 2010 with anterior wall akinesis  . Coronary artery disease 05/2008   Acute anterior ST elevation myocardial infarction . Delayed intervention at [facility] due to mental status changes and suspected stroke. MRI showed old strokes. Cardiac cath showed an occluded mid LAD with 50% mid RCA stenosis and mild left circumflex disease. She had thrombectomy and bare-metal stent placement to the mid LAD (2.75 x 24 mm). Ejection fraction was 25%  . Hyperlipidemia   . Hypertension   . MI (myocardial infarction) ([place])   . Stroke [facility])    Noted on MRI in 2010  . Tobacco abuse     Past Surgical History:  Procedure Laterality Date  . BACK SURGERY    . CARDIAC CATHETERIZATION  2010   [facility]  . CORONARY ANGIOPLASTY    . THROAT  SURGERY    . THYROIDECTOMY      Medications Prior to Admission  Medication Sig Dispense Refill Last Dose  . clopidogrel (PLAVIX) 75 MG tablet Take 1 tablet (75 mg total) by mouth daily. 30 tablet 6 Taking  . gabapentin (NEURONTIN) 300 MG capsule Takes 1-2 tablets daily at bedtime.   Taking  . lisinopril (PRINIVIL,ZESTRIL) 5 MG tablet Take 1 tablet (5 mg total) by mouth daily. 30 tablet 6   . metoprolol succinate (TOPROL XL) 25 MG 24 hr tablet Take 1 tablet (25 mg total) by mouth daily. 30 tablet 6 Taking  . nitroGLYCERIN (NITROSTAT) 0.4 MG SL tablet Place 0.4 mg under the tongue every 5 (five) minutes as needed.   Taking  . simvastatin (ZOCOR) 40 MG tablet Take 1 tablet (40 mg total) by mouth daily. 30 tablet 6 Taking  . spironolactone (ALDACTONE) 25 MG tablet Take 1 tablet (25 mg total) by mouth daily. 30 tablet 6   . warfarin (COUMADIN) 5 MG tablet Take 1 tablet (5 mg total) by mouth at bedtime. 30 tablet 3    Social History   Socioeconomic History  . Marital status: Single    Spouse name: Not on file  . Number of children: Not on file  . Years of education: Not on file  . Highest education level: Not on file  Occupational History  . Not on file  Social Needs  . Financial resource strain: Not on file  . Food insecurity:    Worry: Not on file    Inability: Not on  file  . Transportation needs:    Medical: Not on file    Non-medical: Not on file  Tobacco Use  . Smoking status: Current Every Day Smoker    Packs/day: 0.25    Years: 25.00    Pack years: 6.25    Types: Cigarettes  . Smokeless tobacco: Never Used  Substance and Sexual Activity  . Alcohol use: No  . Drug use: No  . Sexual activity: Not on file  Lifestyle  . Physical activity:    Days per week: Not on file    Minutes per session: Not on file  . Stress: Not on file  Relationships  . Social connections:    Talks on phone: Not on file    Gets together: Not on file    Attends religious service: Not on file     Active member of club or organization: Not on file    Attends meetings of clubs or organizations: Not on file    Relationship status: Not on file  . Intimate partner violence:    Fear of current or ex partner: Not on file    Emotionally abused: Not on file    Physically abused: Not on file    Forced sexual activity: Not on file  Other Topics Concern  . Not on file  Social History Narrative  . Not on file    History reviewed. No pertinent family history.    Review of systems complete and found to be negative unless listed above      PHYSICAL EXAM  General: Well developed, well nourished, in no acute distress HEENT:  Normocephalic and atramatic Neck:  No JVD.  Lungs: Clear bilaterally to auscultation and percussion. Heart: HRRR . Normal S1 and S2 without gallops or murmurs.  Abdomen: Bowel sounds are positive, abdomen soft and non-tender  Msk:  Back normal, normal gait. Normal strength and tone for age. Extremities: No clubbing, cyanosis or edema.   Neuro: Alert and oriented X 3. Psych:  Good affect, responds appropriately  Labs:   Lab Results  Component Value Date   WBC 10.8 (H) 03/16/2018   HGB 16.7 (H) 03/16/2018   HCT 48.3 (H) 03/16/2018   MCV 99.4 03/16/2018   PLT 249 03/16/2018    Recent Labs  Lab 03/16/18 1726  NA 142  K 3.2*  CL 109  CO2 23  BUN 7  CREATININE 0.59  CALCIUM 9.3  PROT 7.6  BILITOT 0.8  ALKPHOS 113  ALT 16  AST 29  GLUCOSE 108*   Lab Results  Component Value Date   CKMB 1.2 06/20/2013   TROPONINI 0.80 (HH) 03/16/2018    Lab Results  Component Value Date   CHOL 203 (H) 03/16/2018   Lab Results  Component Value Date   HDL 49 03/16/2018   Lab Results  Component Value Date   LDLCALC 120 (H) 03/16/2018   Lab Results  Component Value Date   TRIG 168 (H) 03/16/2018   Lab Results  Component Value Date   CHOLHDL 4.1 03/16/2018   No results found for: LDLDIRECT    Radiology: Dg Chest Port 1 View  Result Date:  03/16/2018 CLINICAL DATA:  Chest pain and myocardial infarction. EXAM: PORTABLE CHEST 1 VIEW COMPARISON:  09/07/2005 radiograph FINDINGS: Cardiomegaly and mild pulmonary vascular congestion noted. There is no evidence of focal airspace disease, pulmonary edema, suspicious pulmonary nodule/mass, pleural effusion, or pneumothorax. No acute bony abnormalities are identified. IMPRESSION: Cardiomegaly with mild pulmonary vascular congestion. Electronically Signed  By: Margarette Canada M.D.   On: 03/16/2018 17:47    EKG: Normal sinus rhythm with ST elevation in leads II, III and aVF  ASSESSMENT AND PLAN:   1.  Acute inferior ST elevation myocardial infarction 2.  Known CAD, history of anterior and inferior ST elevation myocardial infarction with bare metal stents mid LAD and drug-eluting stent in distal RCA 3.  Ischemic cardiomyopathy with known LVEF of 25 to 35% 4.  History of CVA 5.  COPD/tobacco abuse  Recommendations  1.  Emergent cardiac catheterization and probable primary PCI of RCA  Signed: Isaias Cowman MD,PhD, [facility] 03/16/2018, 8:14 PM

## 2018-03-17 ENCOUNTER — Inpatient Hospital Stay
Admit: 2018-03-17 | Discharge: 2018-03-17 | Disposition: A | Discharge status: Home / Self Care | Payer: Medicaid Other | Attending: Physician Assistant | Admitting: Physician Assistant

## 2018-03-17 ENCOUNTER — Encounter: Payer: Self-pay | Admitting: Cardiology

## 2018-03-17 LAB — CBC
HCT: 46 % (ref 36.0–46.0)
Hemoglobin: 15.2 g/dL — ABNORMAL HIGH (ref 12.0–15.0)
MCH: 34 pg (ref 26.0–34.0)
MCHC: 33 g/dL (ref 30.0–36.0)
MCV: 102.9 fL — ABNORMAL HIGH (ref 80.0–100.0)
Platelets: 272 10*3/uL (ref 150–400)
RBC: 4.47 MIL/uL (ref 3.87–5.11)
RDW: 12.1 % (ref 11.5–15.5)
WBC: 11.6 10*3/uL — ABNORMAL HIGH (ref 4.0–10.5)
nRBC: 0 % (ref 0.0–0.2)

## 2018-03-17 LAB — BASIC METABOLIC PANEL
Anion gap: 6 (ref 5–15)
BUN: 7 mg/dL (ref 6–20)
CHLORIDE: 108 mmol/L (ref 98–111)
CO2: 24 mmol/L (ref 22–32)
Calcium: 9 mg/dL (ref 8.9–10.3)
Creatinine, Ser: 0.56 mg/dL (ref 0.44–1.00)
GFR calc Af Amer: >60 mL/min (ref >60)
GFR calc non Af Amer: >60 mL/min (ref >60)
Glucose, Bld: 165 mg/dL — ABNORMAL HIGH (ref 70–99)
Potassium: 4.6 mmol/L (ref 3.5–5.1)
Sodium: 138 mmol/L (ref 135–145)

## 2018-03-17 LAB — TROPONIN I
TROPONIN I: 27.07 ng/mL — AB (ref <0.03)
Troponin I: 59.65 ng/mL (ref <0.03)

## 2018-03-17 LAB — GLUCOSE, CAPILLARY: Glucose-Capillary: 172 mg/dL — ABNORMAL HIGH (ref 70–99)

## 2018-03-17 LAB — PROTIME-INR
INR: 1.01
PROTHROMBIN TIME: 13.2 s (ref 11.4–15.2)

## 2018-03-17 MED ORDER — WARFARIN SODIUM 7.5 MG PO TABS
7.5000 mg | ORAL_TABLET | Freq: Every day | ORAL | Status: DC
  Administered 2018-03-17: 7.5 mg via ORAL
  Filled 2018-03-17: qty 1

## 2018-03-17 MED ORDER — ENSURE ENLIVE PO LIQD
237.0000 mL | Freq: Two times a day (BID) | ORAL | Status: DC
  Administered 2018-03-18: 237 mL via ORAL

## 2018-03-17 MED ORDER — CLOPIDOGREL BISULFATE 75 MG PO TABS
75.0000 mg | ORAL_TABLET | Freq: Every day | ORAL | Status: DC
  Administered 2018-03-17 – 2018-03-19 (×3): 75 mg via ORAL
  Filled 2018-03-17 (×3): qty 1

## 2018-03-17 MED ORDER — PERFLUTREN LIPID MICROSPHERE
1.0000 mL | INTRAVENOUS | Status: AC | PRN
  Administered 2018-03-17: 2 mL via INTRAVENOUS
  Filled 2018-03-17: qty 10

## 2018-03-17 NOTE — Progress Notes (Signed)
   03/17/18 1200  Clinical Encounter Type  Visited With Other (Comment)  Visit Type Initial  Referral From Nurse  Consult/Referral To [place] went to visit patient, she was not available, talking with financial person. Chaplain will return later

## 2018-03-17 NOTE — Progress Notes (Signed)
   03/17/18 1400  Clinical Encounter Type  Visited With Patient and family together  Visit Type Follow-up;Spiritual support  Referral From Nurse  Consult/Referral To Chaplain  Spiritual Encounters  Spiritual Needs Prayer  Stress Factors  Patient Stress Factors Health changes  Chaplain received OR for prayer and follow back up with visit. Chaplain introduce herself to patient and family daughter and husband who was at bedside. Chaplain ask patient was she feeling better today and patient said yes she was feeling better just a little sore. Chaplain said she was glad to hear that and offered prayer. Patient  Ask if Chaplain can pray and Chaplain said sure and prayed.

## 2018-03-17 NOTE — Progress Notes (Signed)
[place] [facility] [place] NAME: Brianna Roth    MR#:  [id number]  DATE OF BIRTH:  07-24-1960  SUBJECTIVE:  Recovering well, no events overnight, case discussed with intensivist, for 2D echo  REVIEW OF SYSTEMS:  CONSTITUTIONAL: No fever, fatigue or weakness.  EYES: No blurred or double vision.  EARS, NOSE, AND THROAT: No tinnitus or ear pain.  RESPIRATORY: No cough, shortness of breath, wheezing or hemoptysis.  CARDIOVASCULAR: No chest pain, orthopnea, edema.  GASTROINTESTINAL: No nausea, vomiting, diarrhea or abdominal pain.  GENITOURINARY: No dysuria, hematuria.  ENDOCRINE: No polyuria, nocturia,  HEMATOLOGY: No anemia, easy bruising or bleeding SKIN: No rash or lesion. MUSCULOSKELETAL: No joint pain or arthritis.   NEUROLOGIC: No tingling, numbness, weakness.  PSYCHIATRY: No anxiety or depression.   ROS  DRUG ALLERGIES:  No Known Allergies  VITALS:  Blood pressure 121/80, pulse 85, temperature 98.5 F (36.9 C), temperature source Axillary, resp. rate 15, height 5\' 2"  (1.575 m), weight 74.8 kg, SpO2 99 %.  PHYSICAL EXAMINATION:  GENERAL:  58 y.o.-year-old patient lying in the bed with no acute distress.  EYES: Pupils equal, round, reactive to light and accommodation. No scleral icterus. Extraocular muscles intact.  HEENT: Head atraumatic, normocephalic. Oropharynx and nasopharynx clear.  NECK:  Supple, no jugular venous distention. No thyroid enlargement, no tenderness.  LUNGS: Normal breath sounds bilaterally, no wheezing, rales,rhonchi or crepitation. No use of accessory muscles of respiration.  CARDIOVASCULAR: S1, S2 normal. No murmurs, rubs, or gallops.  ABDOMEN: Soft, nontender, nondistended. Bowel sounds present. No organomegaly or mass.  EXTREMITIES: No pedal edema, cyanosis, or clubbing.  NEUROLOGIC: Cranial nerves II through XII are intact. Muscle strength 5/5 in all extremities. Sensation intact. Gait not checked.  PSYCHIATRIC:  The patient is alert and oriented x 3.  SKIN: No obvious rash, lesion, or ulcer.   Physical Exam LABORATORY PANEL:   CBC Recent Labs  Lab 03/17/18 0304  WBC 11.6*  HGB 15.2*  HCT 46.0  PLT 272   ------------------------------------------------------------------------------------------------------------------  Chemistries  Recent Labs  Lab 03/16/18 1726 03/16/18 2057 03/17/18 0304  NA 142 139 138  K 3.2* 3.7 4.6  CL 109 107 108  CO2 23 23 24   GLUCOSE 108* 173* 165*  BUN 7 7 7   CREATININE 0.59 0.48 0.56  CALCIUM 9.3 8.9 9.0  MG  --  2.1  --   AST 29  --   --   ALT 16  --   --   ALKPHOS 113  --   --   BILITOT 0.8  --   --    ------------------------------------------------------------------------------------------------------------------  Cardiac Enzymes Recent Labs  Lab 03/17/18 0304 03/17/18 0934  TROPONINI 59.65* 27.07*   ------------------------------------------------------------------------------------------------------------------  RADIOLOGY:  Dg Chest Port 1 View  Result Date: 03/16/2018 CLINICAL DATA:  Chest pain and myocardial infarction. EXAM: PORTABLE CHEST 1 VIEW COMPARISON:  09/07/2005 radiograph FINDINGS: Cardiomegaly and mild pulmonary vascular congestion noted. There is no evidence of focal airspace disease, pulmonary edema, suspicious pulmonary nodule/mass, pleural effusion, or pneumothorax. No acute bony abnormalities are identified. IMPRESSION: Cardiomegaly with mild pulmonary vascular congestion. Electronically Signed   By: Margarette Canada M.D.   On: 03/16/2018 17:47    ASSESSMENT AND PLAN:  58 year old female with a history of CAD/stent who presents with chest pain and found to have STEMI  *Acute Inferior STEMI Resolving Status post heart cath with successful PCI with overlapping DES mid and distal RCA, noted ICM ejection fraction 25-30% Continue Coumadin,  DAPT with aspirin/Plavix, follow-up on echocardiogram beta-blocker, ACE inhibitor,  statin therapy  *Chronic essential hypertension Continue lisinopril, metoprolol, vitals per routine, make changes as per necessary  *Chronic hyperlipidemia Continue statin therapy  *Chronic tobacco dependence Tobacco cessation counseling ordered, nicotine patch PRN  Disposition pending clinical course  All the records are reviewed and case discussed with Care Management/Social Workerr. Management plans discussed with the patient, family and they are in agreement.  CODE STATUS: full  TOTAL TIME TAKING CARE OF THIS PATIENT: 35 minutes.     POSSIBLE D/C IN 1-2 DAYS, DEPENDING ON CLINICAL CONDITION.   Avel Peace Shamarcus Hoheisel M.D on 03/17/2018   Between 7am to 6pm - Pager - [phone]  After 6pm go to [email or web address] - password [email or web address] [facility]  Office  [phone]  CC: Primary care physician; Patient, No Pcp Per  Note: This dictation was prepared with Dragon dictation along with smaller phrase technology. Any transcriptional errors that result from this process are unintentional.

## 2018-03-17 NOTE — Progress Notes (Signed)
CRITICAL CARE NOTE  CC  Patient doing well resting in bed mild pain at right groin.  Family at bedside [facility] , questions answered.  SUBJECTIVE Status post left heart cath, groin site clean and dry clots.     SIGNIFICANT EVENTS    BP 91/65   Pulse 84   Temp 98.7 F (37.1 C) (Oral)   Resp 15   Ht 5\' 2"  (1.575 m)   Wt 72.3 kg Comment: bed scale  SpO2 98%   BMI 29.15 kg/m    REVIEW OF SYSTEMS  PATIENT IS UNABLE TO PROVIDE COMPLETE REVIEW OF SYSTEMS DUE TO SEVERE CRITICAL ILLNESS   PHYSICAL EXAMINATION:  GENERAL:critically ill appearing, +resp distress HEAD: Normocephalic, atraumatic.  EYES: Pupils equal, round, reactive to light.  No scleral icterus.  MOUTH: Moist mucosal membrane. NECK: Supple. No thyromegaly. No nodules. No JVD.  PULMONARY: +rhonchi, +wheezing CARDIOVASCULAR: S1 and S2. Regular rate and rhythm. No murmurs, rubs, or gallops.  GASTROINTESTINAL: Soft, nontender, -distended. No masses. Positive bowel sounds. No hepatosplenomegaly.  MUSCULOSKELETAL: No swelling, clubbing, or edema.  NEUROLOGIC: obtunded, GCS<8 SKIN:intact,warm,dry      Indwelling Urinary Catheter continued, requirement due to   Reason to continue Indwelling Urinary Catheter for strict Intake/Output monitoring for hemodynamic instability   Central Line continued, requirement due to   Reason to continue [employer] Monitoring of central venous pressure or other hemodynamic parameters   Ventilator continued, requirement due to, resp failure    Ventilator Sedation RASS 0 to -2     ASSESSMENT AND PLAN SYNOPSIS   CARDIAC FAILURE- -STEMI status post cath -Ischemic cardiomyopathy with low EF -Cardiology on case appreciate recommendations ICU monitoring  Renal Failure-most likely due to ATN -follow chem 7 -follow UO -continue Foley Catheter-assess need daily   NEUROLOGY -History of CVA -PT OT evaluation    GI/Nutrition GI PROPHYLAXIS as  indicated DIET-->TF's as tolerated Constipation protocol as indicated  ENDO - ICU hypoglycemic\Hyperglycemia protocol -check FSBS per protocol   ELECTROLYTES -follow labs as needed -replace as needed -pharmacy consultation and following   DVT/GI PRX ordered TRANSFUSIONS AS NEEDED MONITOR FSBS ASSESS the need for LABS as needed     Ottie Glazier, M.D.  Pulmonary & Critical Care Medicine

## 2018-03-17 NOTE — Progress Notes (Signed)
Initial Nutrition Assessment  DOCUMENTATION CODES:   Not applicable  INTERVENTION:  Provide Ensure Enlive po BID, each supplement provides 350 kcal and 20 grams of protein. Patient prefers chocolate.  Provided copy of [place]. Reviewed food assistance resources listed in guide on pages 7-9 and encouraged patient to utilize these resources.  Provided recipe for a homemade oral nutrition supplement patient can make and drink at home. Encouraged patient to drink 2 servings per day.  Encouraged intake of small, frequent meals throughout the day that are calorie- and protein-dense to prevent further unintentional weight loss/loss of lean body mass.  NUTRITION DIAGNOSIS:   Inadequate oral intake related to poor appetite as evidenced by per patient/family report.  GOAL:   Patient will meet greater than or equal to 90% of their needs  MONITOR:   PO intake, Supplement acceptance, Labs, I & O's  REASON FOR ASSESSMENT:   Malnutrition Screening Tool    ASSESSMENT:   58 year old female with PMHx of asthma, HTN, CAD, hx MI s/p stent to mid LAD, HLD, hx CVA 3299, chronic systolic heart failure (LV EF 25-30%) admitted with inferior wall STEMI s/p PCI/DES to mid and distal RCA.   Met with patient, her significant other, and her daughters at bedside. Patient reports she has had a poor appetite for years now. She experiences anorexia and early satiety. There is also a component of food insecurity as her significant other has been laid off from work and they have limited ability to afford groceries. She reports only eating one meal per day now. It is usually 1/2-1 sandwich with chips. Patient enjoys Ensure and wants to drink some here but reports she cannot afford them at home. Patient ate 0% of breakfast this morning but her daughters were going to help her eat some lunch.  Patient unsure of UBW but can tell she has been losing weight. RD obtained bed  scale weight of 72.3 kg (159.39 lbs) today. The only other weights in chart are from 2013 so unable to trend weight loss to see if it is significant for time frame.  Medications reviewed and include: lisinopril, warfarin.  Labs reviewed.  Patient does not meet criteria for malnutrition at this time, but is certainly at risk for malnutrition in setting of anorexia/early satiety, inadequate oral intake, food insecurity, and catabolic nature of CHF.  Discussed with RN and on rounds.  NUTRITION - FOCUSED PHYSICAL EXAM:    Most Recent Value  Orbital Region  No depletion  Upper Arm Region  Mild depletion  Thoracic and Lumbar Region  No depletion  Buccal Region  No depletion  Temple Region  Mild depletion  Clavicle Bone Region  Mild depletion  Clavicle and Acromion Bone Region  Mild depletion  Scapular Bone Region  Mild depletion  Dorsal Hand  No depletion  Patellar Region  No depletion  Anterior Thigh Region  No depletion  Posterior Calf Region  No depletion  Edema (RD Assessment)  None  Hair  Reviewed  Eyes  Reviewed  Mouth  Reviewed  Skin  Reviewed  Nails  Reviewed     Diet Order:   Diet Order            Diet Heart Room service appropriate? Yes; Fluid consistency: Thin  Diet effective now             EDUCATION NEEDS:   Education needs have been addressed  Skin:  Skin Assessment: Reviewed RN Assessment  Last BM:  Unknown  Height:   Ht Readings from Last 1 Encounters:  03/16/18 5' 2"  (1.575 m)   Weight:   Wt Readings from Last 1 Encounters:  03/17/18 72.3 kg   Ideal Body Weight:  50 kg  BMI:  Body mass index is 29.15 kg/m.  Estimated Nutritional Needs:   Kcal:  1520-1770 (MSJ x 1.2-1.4)  Protein:  85-100 grams (1.2-1.4 grams/kg)  Fluid:  1.5-1.7 L/day (1 mL/kcal)  Willey Blade, MS, RD, LDN Office: [phone] Pager: [phone] After Hours/Weekend Pager: [phone]

## 2018-03-17 NOTE — Progress Notes (Signed)
*  PRELIMINARY RESULTS* Echocardiogram 2D Echocardiogram has been performed.  Brianna Roth 03/17/2018, 2:26 PM

## 2018-03-17 NOTE — Progress Notes (Signed)
[facility] Cardiology  SUBJECTIVE: The patient denies chest pain. She is tearful and reports feeling upset at her family without specific complaints.    Vitals:   03/17/18 0500 03/17/18 0600 03/17/18 0700 03/17/18 0800  BP: 103/77 113/80 110/73 (!) 136/100  Pulse: 76 82 75 (!) 117  Resp: 19 20 20 15   Temp:      TempSrc:      SpO2: 96% 96% 98% 99%  Weight:      Height:         Intake/Output Summary (Last 24 hours) at 03/17/2018 6387 Last data filed at 03/17/2018 5643 Gross per 24 hour  Intake -  Output 800 ml  Net -800 ml      PHYSICAL EXAM  General: Well developed, well nourished, tearful, no acute distress HEENT:  Normocephalic and atramatic Neck:  No JVD.  Lungs: normal effort of breathing on supplemental oxygen Heart: HRRR . Normal S1 and S2 without gallops or murmurs.  Abdomen: nondistended Msk:  No obvious deformity. Extremities: No clubbing, cyanosis or edema.   Right femoral: pressure dressing intact, no drainage, bleeding, or bruising Neuro: Alert and oriented X 3. Psych:  Responds appropriately, tearful, but easily calmed down with talking   LABS: Basic Metabolic Panel: Recent Labs    03/16/18 2057 03/17/18 0304  NA 139 138  K 3.7 4.6  CL 107 108  CO2 23 24  GLUCOSE 173* 165*  BUN 7 7  CREATININE 0.48 0.56  CALCIUM 8.9 9.0  MG 2.1  --   PHOS 3.4  --    Liver Function Tests: Recent Labs    03/16/18 1726  AST 29  ALT 16  ALKPHOS 113  BILITOT 0.8  PROT 7.6  ALBUMIN 4.3   No results for input(s): LIPASE, AMYLASE in the last 72 hours. CBC: Recent Labs    03/16/18 1726 03/17/18 0304  WBC 10.8* 11.6*  NEUTROABS 5.2  --   HGB 16.7* 15.2*  HCT 48.3* 46.0  MCV 99.4 102.9*  PLT 249 272   Cardiac Enzymes: Recent Labs    03/16/18 1726 03/16/18 2057 03/17/18 0304  TROPONINI 0.80* 20.25* 59.65*   BNP: Invalid input(s): POCBNP D-Dimer: No results for input(s): DDIMER in the last 72 hours. Hemoglobin A1C: Recent Labs    03/16/18 1726   HGBA1C 5.0   Fasting Lipid Panel: Recent Labs    03/16/18 1726  CHOL 203*  HDL 49  LDLCALC 120*  TRIG 168*  CHOLHDL 4.1   Thyroid Function Tests: No results for input(s): TSH, T4TOTAL, T3FREE, THYROIDAB in the last 72 hours.  Invalid input(s): FREET3 Anemia Panel: No results for input(s): VITAMINB12, FOLATE, FERRITIN, TIBC, IRON, RETICCTPCT in the last 72 hours.  Dg Chest Port 1 View  Result Date: 03/16/2018 CLINICAL DATA:  Chest pain and myocardial infarction. EXAM: PORTABLE CHEST 1 VIEW COMPARISON:  09/07/2005 radiograph FINDINGS: Cardiomegaly and mild pulmonary vascular congestion noted. There is no evidence of focal airspace disease, pulmonary edema, suspicious pulmonary nodule/mass, pleural effusion, or pneumothorax. No acute bony abnormalities are identified. IMPRESSION: Cardiomegaly with mild pulmonary vascular congestion. Electronically Signed   By: Margarette Canada M.D.   On: 03/16/2018 17:47     TELEMETRY: sinus rhythm  ASSESSMENT AND PLAN:  Active Problems:   ST elevation myocardial infarction (STEMI) ([place])   Ischemic cardiomyopathy   Essential hypertension    1. Acute inferior ST elevation MI, status post emergent cardiac catheterization which revealed occluded distal RCA, patent stent mid LAD, severe dilated cardiomyopathy, with successful primary  PCI with overlapping DES mid and distal RCA 2. Known CAD with a history of anterior and inferior STEMI's with BMS mid LAD and DES distal RCA. 3. Ischemic cardiomyopathy with LVEF 25-30%, with history of apical thrombus 4. COPD, ongoing tobacco abuse 5. History of stroke  Recommendations: 1. Continue warfarin, aspirin, Plavix 2. Social work consult as patient has not taken her medications in years due to cost. 3. Cardiac rehab 4. 2D echocardiogram 5. Strongly encouraged patient to stop smoking 6. Strongly encouraged patient to take her medications as prescribed as outpatient 7. Continue simvastatin, metoprolol, and  lisinopril  Clabe Seal, PA-C 03/17/2018 8:37 AM

## 2018-03-17 NOTE — Progress Notes (Signed)
ANTICOAGULATION CONSULT NOTE - Follow Up Consult  63 yoF presents to ED complaining of chest pain that started on 03/15/18. Reported episodes of N/V and dizziness. PMH consists of 2 MI's with stent placement, cancer, chronic systolic heart failure, CAD, hyperlipidemia, hypertension, stroke, and asthma. Pharmacy is consulted for Warfarin dosing.   **I talked to patient about her medications and last dose. Hasn't taken any medications for her heart conditions and htn for 6 years due to being taken off of Medicaid. Pt. Has not taken warfarin in 6 years*   Pharmacy Consult for Warfarin   No Known Allergies  Patient Measurements: Height: 5\' 2"  (157.5 cm) Weight: 159 lb 6.3 oz (72.3 kg)(bed scale) IBW/kg (Calculated) : 50.1   Vital Signs: Temp: 98.5 F (36.9 C) (02/03 0800) Temp Source: Axillary (02/03 0800) BP: 125/91 (02/03 1430) Pulse Rate: 83 (02/03 1430)  Labs: Recent Labs    03/16/18 1726 03/16/18 2057 03/17/18 0304 03/17/18 0934  HGB 16.7*  --  15.2*  --   HCT 48.3*  --  46.0  --   PLT 249  --  272  --   APTT 30  --   --   --   LABPROT 12.6  --  13.2  --   INR 0.95  --  1.01  --   CREATININE 0.59 0.48 0.56  --   TROPONINI 0.80* 20.25* 59.65* 27.07*    Estimated Creatinine Clearance: 72.3 mL/min (by C-G formula based on SCr of 0.56 mg/dL).   Medications:  Warfarin 7.5mg  by mouth daily. Pt. Did not receive warfarin dose last night due to nausea.   Assessment: 2/3 0304 INR: 1.01  Goal of Therapy:  INR 2-3 Monitor platelets by anticoagulation protocol: Yes   Plan:  Continue warfarin 7.5 mg by mouth once a day x 1 dose. Monitor INR/CBC with AM labs and reassess need for warfarin.  Erma 03/17/2018,3:23 PM  Pharmacy Student

## 2018-03-18 LAB — CBC
HCT: 43.7 % (ref 36.0–46.0)
Hemoglobin: 14.4 g/dL (ref 12.0–15.0)
MCH: 34.4 pg — ABNORMAL HIGH (ref 26.0–34.0)
MCHC: 33 g/dL (ref 30.0–36.0)
MCV: 104.3 fL — ABNORMAL HIGH (ref 80.0–100.0)
Platelets: 203 10*3/uL (ref 150–400)
RBC: 4.19 MIL/uL (ref 3.87–5.11)
RDW: 12.1 % (ref 11.5–15.5)
WBC: 11.9 10*3/uL — ABNORMAL HIGH (ref 4.0–10.5)
nRBC: 0 % (ref 0.0–0.2)

## 2018-03-18 LAB — BASIC METABOLIC PANEL
Anion gap: 5 (ref 5–15)
BUN: 7 mg/dL (ref 6–20)
CO2: 25 mmol/L (ref 22–32)
Calcium: 9 mg/dL (ref 8.9–10.3)
Chloride: 107 mmol/L (ref 98–111)
Creatinine, Ser: 0.56 mg/dL (ref 0.44–1.00)
GFR calc Af Amer: >60 mL/min (ref >60)
GFR calc non Af Amer: >60 mL/min (ref >60)
Glucose, Bld: 102 mg/dL — ABNORMAL HIGH (ref 70–99)
Potassium: 4.2 mmol/L (ref 3.5–5.1)
SODIUM: 137 mmol/L (ref 135–145)

## 2018-03-18 LAB — ECHOCARDIOGRAM COMPLETE
Height: 62 in
WEIGHTICAEL: 2640 [oz_av]

## 2018-03-18 LAB — PROTIME-INR
INR: 1.07
Prothrombin Time: 13.8 seconds (ref 11.4–15.2)

## 2018-03-18 MED ORDER — LACTATED RINGERS IV BOLUS
500.0000 mL | Freq: Once | INTRAVENOUS | Status: AC
  Administered 2018-03-18: 500 mL via INTRAVENOUS

## 2018-03-18 MED ORDER — ATORVASTATIN CALCIUM 20 MG PO TABS: 40.0000 mg | ORAL_TABLET | Freq: Every day | ORAL | Status: DC

## 2018-03-18 NOTE — Progress Notes (Signed)
Pastoral Care Visit    03/18/18 1630  Clinical Encounter Type  Visited With Patient and family together  Visit Type Follow-up;Spiritual support;Critical Care  Referral From Family  Consult/Referral To Chaplain  Spiritual Encounters  Spiritual Needs Prayer  Stress Factors  Patient Stress Factors Health changes  Family Stress Factors Health changes   Pt was sitting up, smiling, and talking freely.  Pt and family are thankful that pt is doing much better and hopeful about going home soon (hopefully tomorrow). Pt did express some disappointment that nobody had come to get her up so she could walk even though staff had told her this would happen.  Darcey Nora, Chaplain

## 2018-03-18 NOTE — Progress Notes (Addendum)
[facility] NAME: Brianna Roth    MR#:  [id number]  DATE OF BIRTH:  1960/11/01  SUBJECTIVE:  Recovering well, no events overnight, case discussed with intensivist, cardiology input appreciated, for transfer to regular nursing floor, possible discharge tomorrow  REVIEW OF SYSTEMS:  CONSTITUTIONAL: No fever, fatigue or weakness.  EYES: No blurred or double vision.  EARS, NOSE, AND THROAT: No tinnitus or ear pain.  RESPIRATORY: No cough, shortness of breath, wheezing or hemoptysis.  CARDIOVASCULAR: No chest pain, orthopnea, edema.  GASTROINTESTINAL: No nausea, vomiting, diarrhea or abdominal pain.  GENITOURINARY: No dysuria, hematuria.  ENDOCRINE: No polyuria, nocturia,  HEMATOLOGY: No anemia, easy bruising or bleeding SKIN: No rash or lesion. MUSCULOSKELETAL: No joint pain or arthritis.   NEUROLOGIC: No tingling, numbness, weakness.  PSYCHIATRY: No anxiety or depression.   ROS  DRUG ALLERGIES:  No Known Allergies  VITALS:  Blood pressure 94/67, pulse 95, temperature 98.9 F (37.2 C), temperature source Oral, resp. rate 15, height 5\' 2"  (1.575 m), weight 72.3 kg, SpO2 96 %.  PHYSICAL EXAMINATION:  GENERAL:  58 y.o.-year-old patient lying in the bed with no acute distress.  EYES: Pupils equal, round, reactive to light and accommodation. No scleral icterus. Extraocular muscles intact.  HEENT: Head atraumatic, normocephalic. Oropharynx and nasopharynx clear.  NECK:  Supple, no jugular venous distention. No thyroid enlargement, no tenderness.  LUNGS: Normal breath sounds bilaterally, no wheezing, rales,rhonchi or crepitation. No use of accessory muscles of respiration.  CARDIOVASCULAR: S1, S2 normal. No murmurs, rubs, or gallops.  ABDOMEN: Soft, nontender, nondistended. Bowel sounds present. No organomegaly or mass.  EXTREMITIES: No pedal edema, cyanosis, or clubbing.  NEUROLOGIC: Cranial nerves II through XII are intact. Muscle  strength 5/5 in all extremities. Sensation intact. Gait not checked.  PSYCHIATRIC: The patient is alert and oriented x 3.  SKIN: No obvious rash, lesion, or ulcer.   Physical Exam LABORATORY PANEL:   CBC Recent Labs  Lab 03/18/18 0403  WBC 11.9*  HGB 14.4  HCT 43.7  PLT 203   ------------------------------------------------------------------------------------------------------------------  Chemistries  Recent Labs  Lab 03/16/18 1726 03/16/18 2057  03/18/18 0403  NA 142 139   < > 137  K 3.2* 3.7   < > 4.2  CL 109 107   < > 107  CO2 23 23   < > 25  GLUCOSE 108* 173*   < > 102*  BUN 7 7   < > 7  CREATININE 0.59 0.48   < > 0.56  CALCIUM 9.3 8.9   < > 9.0  MG  --  2.1  --   --   AST 29  --   --   --   ALT 16  --   --   --   ALKPHOS 113  --   --   --   BILITOT 0.8  --   --   --    < > = values in this interval not displayed.   ------------------------------------------------------------------------------------------------------------------  Cardiac Enzymes Recent Labs  Lab 03/17/18 0304 03/17/18 0934  TROPONINI 59.65* 27.07*   ------------------------------------------------------------------------------------------------------------------  RADIOLOGY:  Dg Chest Port 1 View  Result Date: 03/16/2018 CLINICAL DATA:  Chest pain and myocardial infarction. EXAM: PORTABLE CHEST 1 VIEW COMPARISON:  09/07/2005 radiograph FINDINGS: Cardiomegaly and mild pulmonary vascular congestion noted. There is no evidence of focal airspace disease, pulmonary edema, suspicious pulmonary nodule/mass, pleural effusion, or pneumothorax. No acute bony abnormalities are identified. IMPRESSION: Cardiomegaly with mild  pulmonary vascular congestion. Electronically Signed   By: Margarette Canada M.D.   On: 03/16/2018 17:47    ASSESSMENT AND PLAN:  58 year old female with a history of CAD/stent who presents with chest pain and found to have STEMI  *Acute Inferior STEMI Resolving Status post heart  cath with successful PCI with overlapping DES mid and distal RCA, noted ICM ejection fraction 25-30% Continue DAPT for 1 year with aspirin/Plavix, echocardiogram from March 17, 2018 noted for ejection fraction 35%, beta-blocker, ACE inhibitor, statin therapy, transfer to regular nursing for bed, possible discharge on tomorrow Noted high risk for medication noncompliance  *Chronic essential hypertension Stable Continue lisinopril, metoprolol, vitals per routine, make changes as per necessary  *Chronic hyperlipidemia Continue statin therapy  *Chronic tobacco dependence Tobacco cessation counseling ordered, nicotine patch PRN  Disposition home on tomorrow barring any complications   All the records are reviewed and case discussed with Care Management/Social Workerr. Management plans discussed with the patient, family and they are in agreement.  CODE STATUS: full  TOTAL TIME TAKING CARE OF THIS PATIENT: 35 minutes.     POSSIBLE D/C IN 1-2 DAYS, DEPENDING ON CLINICAL CONDITION.   Avel Peace Salary M.D on 03/18/2018   Between 7am to 6pm - Pager - [phone]  After 6pm go to [email or web address] - password [email or web address]  Office  [phone]  CC: Primary care physician; Patient, No Pcp Per  Note: This dictation was prepared with Dragon dictation along with smaller phrase technology. Any transcriptional errors that result from this process are unintentional.

## 2018-03-18 NOTE — Progress Notes (Signed)
Patient transferred by wheelchair to room 250 with [place], NT.  Patient alert with no distress noted when leaving ICU.  Patient reports that her right foot is no longer numb.

## 2018-03-18 NOTE — Progress Notes (Signed)
Spoke with Dr. Lanney Gins and made MD aware that patient's blood pressures have been 13'Y systolically and 86'V diastolic and that Dr. Saralyn Pilar gave order to give metoprolol as scheduled and that since that was given current blood pressure is 94/67 and lisinopril and spironolactone is still due.  MD gave order to not give neither lisinopril nor spironolactone and to given LR 500 cc bolus once.

## 2018-03-18 NOTE — Progress Notes (Signed)
Spoke with Dr. Saralyn Pilar and asked MD about giving patient's metoprolol since her blood pressure has been systolically in the 51'G and is currently 99/67 with heart rate low 100's.  MD gave order to go ahead and give metoprolol.  RN also made MD aware that patient is complaining of her right foot being numb.  Right foot cool compared to left and right pedal pulse is weak compared to left.  Right foot is slightly pale compared to right as well.  Dr. Saralyn Pilar stated that the hospitalist could take a look at it.

## 2018-03-18 NOTE — Progress Notes (Signed)
[facility] Cardiology  SUBJECTIVE: The patient denies chest pain, shortness of breath, or palpitations.    Vitals:   03/18/18 0500 03/18/18 0600 03/18/18 0700 03/18/18 0800  BP: (!) 85/62 92/66 95/65  (!) 88/60  Pulse: 73 78 75 76  Resp: (!) 23 (!) 22 (!) 26 (!) 26  Temp:      TempSrc:      SpO2: 91% 93% 90% 92%  Weight:      Height:         Intake/Output Summary (Last 24 hours) at 03/18/2018 0835 Last data filed at 03/18/2018 0100 Gross per 24 hour  Intake 1020 ml  Output 1700 ml  Net -680 ml      PHYSICAL EXAM  General: Well developed, well nourished, in no acute distress HEENT:  Normocephalic and atramatic Neck:  No JVD.  Lungs: normal effort of breathing on supplemental oxygen Heart: HRRR . Normal S1 and S2 without gallops or murmurs.  Abdomen: nondistended Msk:  No obvious deformity  Extremities: No clubbing, cyanosis or edema.   Neuro: alert and oriented x 3    LABS: Basic Metabolic Panel: Recent Labs    03/16/18 2057 03/17/18 0304 03/18/18 0403  NA 139 138 137  K 3.7 4.6 4.2  CL 107 108 107  CO2 23 24 25   GLUCOSE 173* 165* 102*  BUN 7 7 7   CREATININE 0.48 0.56 0.56  CALCIUM 8.9 9.0 9.0  MG 2.1  --   --   PHOS 3.4  --   --    Liver Function Tests: Recent Labs    03/16/18 1726  AST 29  ALT 16  ALKPHOS 113  BILITOT 0.8  PROT 7.6  ALBUMIN 4.3   No results for input(s): LIPASE, AMYLASE in the last 72 hours. CBC: Recent Labs    03/16/18 1726 03/17/18 0304 03/18/18 0403  WBC 10.8* 11.6* 11.9*  NEUTROABS 5.2  --   --   HGB 16.7* 15.2* 14.4  HCT 48.3* 46.0 43.7  MCV 99.4 102.9* 104.3*  PLT 249 272 203   Cardiac Enzymes: Recent Labs    03/16/18 2057 03/17/18 0304 03/17/18 0934  TROPONINI 20.25* 59.65* 27.07*   BNP: Invalid input(s): POCBNP D-Dimer: No results for input(s): DDIMER in the last 72 hours. Hemoglobin A1C: Recent Labs    03/16/18 1726  HGBA1C 5.0   Fasting Lipid Panel: Recent Labs    03/16/18 1726  CHOL 203*  HDL  49  LDLCALC 120*  TRIG 168*  CHOLHDL 4.1   Thyroid Function Tests: No results for input(s): TSH, T4TOTAL, T3FREE, THYROIDAB in the last 72 hours.  Invalid input(s): FREET3 Anemia Panel: No results for input(s): VITAMINB12, FOLATE, FERRITIN, TIBC, IRON, RETICCTPCT in the last 72 hours.  Dg Chest Port 1 View  Result Date: 03/16/2018 CLINICAL DATA:  Chest pain and myocardial infarction. EXAM: PORTABLE CHEST 1 VIEW COMPARISON:  09/07/2005 radiograph FINDINGS: Cardiomegaly and mild pulmonary vascular congestion noted. There is no evidence of focal airspace disease, pulmonary edema, suspicious pulmonary nodule/mass, pleural effusion, or pneumothorax. No acute bony abnormalities are identified. IMPRESSION: Cardiomegaly with mild pulmonary vascular congestion. Electronically Signed   By: Margarette Canada M.D.   On: 03/16/2018 17:47     Echo 35-40%  TELEMETRY: sinus rhythm, 70s  ASSESSMENT AND PLAN:  Active Problems:   ST elevation myocardial infarction (STEMI) ([place])   Ischemic cardiomyopathy   Essential hypertension    1. Acute inferior ST elevation MI, status post emergent cardiac catheterization which revealed occluded distal RCA, patent stent  mid LAD, severe dilated cardiomyopathy, with successful primary PCI with overlapping DES mid and distal RCA 2. Known CAD with a history of anterior and inferior STEMI's with BMS mid LAD and DES distal RCA. 3. Ischemic cardiomyopathy with LVEF 35-40, with history of apical thrombus 4. COPD, ongoing tobacco abuse 5. History of stroke   Recommendations: 1. Continue aspirin and Plavix uninterrupted for one year 2. Cardiac rehab 3. Social work consult as patient has not taken medications in 6 years 4. Defer continuing warfarin at this time as patient has been off for 6 years; no apical thrombus noted on cardiac catheterization. Patient is at risk of medication noncompliance, poor follow-up, and increased bleeding risk while on DAPT. 5. Continue  simvastatin 6. Transfer to telemetry unit today 7. Plan to discharge tomorrow morning if patient remains stable. 8. Follow-up with Dr. Saralyn Pilar as outpatient in 1 week   Sign off for now; call with any questions.   Clabe Seal, PA-C 03/18/2018 8:35 AM

## 2018-03-18 NOTE — Progress Notes (Signed)
Spoke with Dr. Lanney Gins and made MD aware that patient is complaining of numbness in her right foot.  MD acknowledged and stated he would go in and see patient.

## 2018-03-18 NOTE — Progress Notes (Signed)
CRITICAL CARE NOTE  CC  Post procedure bleeding/monitoring  SUBJECTIVE Patient doing well, optimizing for d/c -complained of leg pallor and pain this am.  Will ask RN to help pt OOB for ROM.      SIGNIFICANT EVENTS    BP (!) 88/60   Pulse 76   Temp 99.1 F (37.3 C) (Oral)   Resp (!) 26   Ht 5\' 2"  (1.575 m)   Wt 72.3 kg Comment: bed scale  SpO2 92%   BMI 29.15 kg/m    REVIEW OF SYSTEMS  10 systems reviewed and are neg as per subjective findings and exam   PHYSICAL EXAMINATION:  GENERAL: NAD HEAD: Normocephalic, atraumatic.  EYES: Pupils equal, round, reactive to light.  No scleral icterus.  MOUTH: Moist mucosal membrane. NECK: Supple. No thyromegaly. No nodules. No JVD.  PULMONARY: CTA CARDIOVASCULAR: S1 and S2. Regular rate and rhythm. No murmurs, rubs, or gallops.  GASTROINTESTINAL: Soft, nontender, -distended. No masses. Positive bowel sounds. No hepatosplenomegaly.  MUSCULOSKELETAL: No swelling, clubbing, or edema.  NEUROLOGIC: obtunded, GCS 14 SKIN:intact,warm,dry      Indwelling Urinary Catheter continued, requirement due to   Reason to continue Indwelling Urinary Catheter for strict Intake/Output monitoring for hemodynamic instability   Central Line continued, requirement due to   Reason to continue [employer] Monitoring of central venous pressure or other hemodynamic parameters   Ventilator continued, requirement due to, resp failure    Ventilator Sedation RASS 0 to -2     ASSESSMENT AND PLAN SYNOPSIS   CARDIAC FAILURE- -STEMI status post cath -Ischemic cardiomyopathy with low EF -Cardiology on case appreciate recommendations ICU monitoring -optimizing for d/c  Renal Failure-most likely due to ATN -follow chem 7 -follow UO -continue Foley Catheter-assess need daily   NEUROLOGY -History of CVA -PT OT evaluation    GI/Nutrition GI PROPHYLAXIS as indicated DIET-->TF's as tolerated Constipation protocol as  indicated  ENDO - ICU hypoglycemic\Hyperglycemia protocol -check FSBS per protocol   ELECTROLYTES -follow labs as needed -replace as needed -pharmacy consultation and following   DVT/GI PRX ordered TRANSFUSIONS AS NEEDED MONITOR FSBS ASSESS the need for LABS as needed     Ottie Glazier, M.D.  Pulmonary & Critical Care Medicine

## 2018-03-18 NOTE — Care Management Note (Signed)
Case Management Note  Patient Details  Name: Brianna Roth MRN: [id number] Date of Birth: December 14, 1960  Subjective/Objective:                  RNCM met with patient and her boyfriend as she is listed as not having health insurance or drug coverage.  Patient is independent from home with daughter and boyfriend that is currently at ICU bedside. She depends on her daughter for transportation. She does not have a PCP but states she plans to follow up with cardiologist. She is currently concerned that her right leg is numb. She states RN aware and states it may be the result of her recent procedure.    Action/Plan:   Referral to [facility] and Medication management. Application delivered for both to patient.   Expected Discharge Date:                  Expected Discharge Plan:     In-House Referral:     Discharge planning Services  CM Consult, [facility], Medication Assistance  Post Acute Care Choice:    Choice offered to:  Patient, Spouse  DME Arranged:    DME Agency:     HH Arranged:    [place] Agency:     Status of Service:  Completed, signed off  If discussed at [employer] of Stay Meetings, dates discussed:    Additional Comments:  Marshell Garfinkel, RN 03/18/2018, 11:28 AM

## 2018-03-18 NOTE — Progress Notes (Signed)
Report given to [place], RN over phone.  Patient has been up in recliner chair for most of shift. Denies chest pain.  Right groin remains bruised.  +1 pedal pulse to right foot.

## 2018-03-19 LAB — CBC
HCT: 42.7 % (ref 36.0–46.0)
Hemoglobin: 14.2 g/dL (ref 12.0–15.0)
MCH: 34 pg (ref 26.0–34.0)
MCHC: 33.3 g/dL (ref 30.0–36.0)
MCV: 102.2 fL — ABNORMAL HIGH (ref 80.0–100.0)
Platelets: 211 10*3/uL (ref 150–400)
RBC: 4.18 MIL/uL (ref 3.87–5.11)
RDW: 11.9 % (ref 11.5–15.5)
WBC: 12.5 10*3/uL — ABNORMAL HIGH (ref 4.0–10.5)
nRBC: 0 % (ref 0.0–0.2)

## 2018-03-19 MED ORDER — NITROGLYCERIN 0.4 MG SL SUBL: 0.4000 mg | SUBLINGUAL_TABLET | SUBLINGUAL | 0 refills | Status: AC | PRN

## 2018-03-19 MED ORDER — GABAPENTIN 300 MG PO CAPS: 300.0000 mg | ORAL_CAPSULE | Freq: Two times a day (BID) | ORAL | 0 refills | Status: AC

## 2018-03-19 MED ORDER — CLOPIDOGREL BISULFATE 75 MG PO TABS: 75.0000 mg | ORAL_TABLET | Freq: Every day | ORAL | 0 refills | Status: DC

## 2018-03-19 MED ORDER — ENSURE ENLIVE PO LIQD: 237.0000 mL | Freq: Two times a day (BID) | ORAL | 0 refills | Status: DC

## 2018-03-19 MED ORDER — SPIRONOLACTONE 25 MG PO TABS: 25.0000 mg | ORAL_TABLET | Freq: Every day | ORAL | 0 refills | Status: AC

## 2018-03-19 MED ORDER — LISINOPRIL 5 MG PO TABS: 5.0000 mg | ORAL_TABLET | Freq: Every day | ORAL | 0 refills | Status: AC

## 2018-03-19 MED ORDER — ASPIRIN 81 MG PO CHEW: 81.0000 mg | CHEWABLE_TABLET | Freq: Every day | ORAL | 0 refills | Status: DC

## 2018-03-19 MED ORDER — METOPROLOL SUCCINATE ER 25 MG PO TB24: 25.0000 mg | ORAL_TABLET | Freq: Every day | ORAL | 0 refills | Status: AC

## 2018-03-19 MED ORDER — ATORVASTATIN CALCIUM 40 MG PO TABS: 40.0000 mg | ORAL_TABLET | Freq: Every day | ORAL | 0 refills | Status: DC

## 2018-03-19 NOTE — Discharge Summary (Addendum)
[facility] [place] NAME: Nayelli Inglis    MR#:  [id number]  DATE OF BIRTH:  [birth year]  DATE OF ADMISSION:  03/16/2018 ADMITTING PHYSICIAN: Isaias Cowman, MD  DATE OF DISCHARGE: No discharge date for patient encounter.  PRIMARY CARE PHYSICIAN: Patient, No Pcp Per    ADMISSION DIAGNOSIS:  STEMI involving oth coronary artery of inferior wall (HCC) [I21.19]  DISCHARGE DIAGNOSIS:  Active Problems:   ST elevation myocardial infarction (STEMI) ([place])   Ischemic cardiomyopathy   Essential hypertension   SECONDARY DIAGNOSIS:   Past Medical History:  Diagnosis Date  . Asthma   . Cancer ([place])   . Chronic systolic heart failure (HCC)    Due to postinfarct cardiomyopathy. Ejection fraction was 30% in 2010 with anterior wall akinesis  . Coronary artery disease 05/2008   Acute anterior ST elevation myocardial infarction . Delayed intervention at [facility] due to mental status changes and suspected stroke. MRI showed old strokes. Cardiac cath showed an occluded mid LAD with 50% mid RCA stenosis and mild left circumflex disease. She had thrombectomy and bare-metal stent placement to the mid LAD (2.75 x 24 mm). Ejection fraction was 25%  . Hyperlipidemia   . Hypertension   . MI (myocardial infarction) ([place])   . Stroke [facility])    Noted on MRI in 2010  . Tobacco abuse     HOSPITAL COURSE:  58 year old female with a history of CAD/stent who presents with chest pain and found to have STEMI  *Acute inferior STEMI Stable Status post heart catheterization noted for occluded distal RCA, patent stent mid LAD, severe dilated cardiomyopathy, with successful primary PCI with overlapping DES mid and distal RCA  Cardiology recommending 1 week follow-up status post discharge with Dr. Saralyn Pilar, continue DAPT with aspirin/Plavix, beta-blocker, ACE inhibitor, statin therapy  *Chronic hyperlipidemia, unspecified Statin therapy  *Chronic  hypertension Plan of care as stated above  *Chronic tobacco smoker abuse/dependency Nicotine patch and cessation counseling ordered  *Chronic noncompliance medical management Compliance encouraged  DISCHARGE CONDITIONS:   Stable Prognosis guarded given history of noncompliance with medical management  CONSULTS OBTAINED:  Treatment Team:  Isaias Cowman, MD  DRUG ALLERGIES:  No Known Allergies  DISCHARGE MEDICATIONS:   Allergies as of 03/19/2018   No Known Allergies     Medication List    TAKE these medications   aspirin 81 MG chewable tablet Chew 1 tablet (81 mg total) by mouth daily. Start taking on:  March 20, 2018   atorvastatin 40 MG tablet Commonly known as:  LIPITOR Take 1 tablet (40 mg total) by mouth daily at 6 PM.   clopidogrel 75 MG tablet Commonly known as:  PLAVIX Take 1 tablet (75 mg total) by mouth daily. Start taking on:  March 20, 2018   feeding supplement (ENSURE ENLIVE) Liqd Take 237 mLs by mouth 2 (two) times daily between meals.   gabapentin 300 MG capsule Commonly known as:  NEURONTIN Take 1 capsule (300 mg total) by mouth 2 (two) times daily. What changed:    how much to take  how to take this  when to take this  additional instructions   lisinopril 5 MG tablet Commonly known as:  PRINIVIL,ZESTRIL Take 1 tablet (5 mg total) by mouth daily.   metoprolol succinate 25 MG 24 hr tablet Commonly known as:  TOPROL XL Take 1 tablet (25 mg total) by mouth daily. Start taking on:  March 20, 2018   nitroGLYCERIN 0.4 MG SL tablet  Commonly known as:  NITROSTAT Place 1 tablet (0.4 mg total) under the tongue every 5 (five) minutes as needed for chest pain. What changed:  reasons to take this   spironolactone 25 MG tablet Commonly known as:  ALDACTONE Take 1 tablet (25 mg total) by mouth daily. Start taking on:  March 20, 2018        DISCHARGE INSTRUCTIONS:      If you experience worsening of your admission  symptoms, develop shortness of breath, life threatening emergency, suicidal or homicidal thoughts you must seek medical attention immediately by calling 911 or calling your MD immediately  if symptoms less severe.  You Must read complete instructions/literature along with all the possible adverse reactions/side effects for all the Medicines you take and that have been prescribed to you. Take any new Medicines after you have completely understood and accept all the possible adverse reactions/side effects.   Please note  You were cared for by a hospitalist during your hospital stay. If you have any questions about your discharge medications or the care you received while you were in the hospital after you are discharged, you can call the unit and asked to speak with the hospitalist on call if the hospitalist that took care of you is not available. Once you are discharged, your primary care physician will handle any further medical issues. Please note that NO REFILLS for any discharge medications will be authorized once you are discharged, as it is imperative that you return to your primary care physician (or establish a relationship with a primary care physician if you do not have one) for your aftercare needs so that they can reassess your need for medications and monitor your lab values.    Today   CHIEF COMPLAINT:  No chief complaint on file.   HISTORY OF PRESENT ILLNESS:  58 y.o. female with a known history of chronic systolic heart failure, CAD and hypertension who presents to the ER due to chest pain. Patient reported that her chest pain started last night was associated with nausea and shortness of breath.  Pain currently is 6 out of 10.  Patient was found to have STEMI. She has been evaluated by cardiology and is being taken to cardiac Cath Lab for acute STEMI.  VITAL SIGNS:  Blood pressure 95/67, pulse 77, temperature 98.2 F (36.8 C), temperature source Oral, resp. rate 16, height 5'  2" (1.575 m), weight 72.3 kg, SpO2 94 %.  I/O:    Intake/Output Summary (Last 24 hours) at 03/19/2018 1231 Last data filed at 03/19/2018 0400 Gross per 24 hour  Intake 577.29 ml  Output 1350 ml  Net -772.71 ml    PHYSICAL EXAMINATION:  GENERAL:  58 y.o.-year-old patient lying in the bed with no acute distress.  EYES: Pupils equal, round, reactive to light and accommodation. No scleral icterus. Extraocular muscles intact.  HEENT: Head atraumatic, normocephalic. Oropharynx and nasopharynx clear.  NECK:  Supple, no jugular venous distention. No thyroid enlargement, no tenderness.  LUNGS: Normal breath sounds bilaterally, no wheezing, rales,rhonchi or crepitation. No use of accessory muscles of respiration.  CARDIOVASCULAR: S1, S2 normal. No murmurs, rubs, or gallops.  ABDOMEN: Soft, non-tender, non-distended. Bowel sounds present. No organomegaly or mass.  EXTREMITIES: No pedal edema, cyanosis, or clubbing.  NEUROLOGIC: Cranial nerves II through XII are intact. Muscle strength 5/5 in all extremities. Sensation intact. Gait not checked.  PSYCHIATRIC: The patient is alert and oriented x 3.  SKIN: No obvious rash, lesion, or ulcer.  DATA REVIEW:   CBC Recent Labs  Lab 03/19/18 0502  WBC 12.5*  HGB 14.2  HCT 42.7  PLT 211    Chemistries  Recent Labs  Lab 03/16/18 1726 03/16/18 2057  03/18/18 0403  NA 142 139   < > 137  K 3.2* 3.7   < > 4.2  CL 109 107   < > 107  CO2 23 23   < > 25  GLUCOSE 108* 173*   < > 102*  BUN 7 7   < > 7  CREATININE 0.59 0.48   < > 0.56  CALCIUM 9.3 8.9   < > 9.0  MG  --  2.1  --   --   AST 29  --   --   --   ALT 16  --   --   --   ALKPHOS 113  --   --   --   BILITOT 0.8  --   --   --    < > = values in this interval not displayed.    Cardiac Enzymes Recent Labs  Lab 03/17/18 0934  TROPONINI 27.07*    Microbiology Results  Results for orders placed or performed during the hospital encounter of 03/16/18  MRSA PCR Screening     Status:  None   Collection Time: 03/16/18  9:53 PM  Result Value Ref Range Status   MRSA by PCR NEGATIVE NEGATIVE Final    Comment:        The GeneXpert MRSA Assay (FDA approved for NASAL specimens only), is one component of a comprehensive MRSA colonization surveillance program. It is not intended to diagnose MRSA infection nor to guide or monitor treatment for MRSA infections. Performed at [facility], [address] [place], [place] [address]     RADIOLOGY:  No results found.  EKG:   Orders placed or performed during the hospital encounter of 03/16/18  . ED EKG  . ED EKG  . EKG 12-Lead immediately post procedure  . EKG 12-Lead  . EKG 12-Lead immediately post procedure  . EKG 12-Lead      Management plans discussed with the patient, family and they are in agreement.  CODE STATUS:     Code Status Orders  (From admission, onward)         Start     Ordered   03/16/18 2043  Full code  Continuous     03/16/18 2042        Code Status History    This patient has a current code status but no historical code status.      TOTAL TIME TAKING CARE OF THIS PATIENT: 40 minutes.    Avel Peace Salary M.D on 03/19/2018 at 12:31 PM  Between 7am to 6pm - Pager - [phone]  After 6pm go to [email or web address] - password [email or web address] [facility]  Office  [phone]  CC: Primary care physician; Patient, No Pcp Per   Note: This dictation was prepared with Dragon dictation along with smaller phrase technology. Any transcriptional errors that result from this process are unintentional.

## 2018-03-19 NOTE — Progress Notes (Signed)
Cardiovascular and Pulmonary Nurse Navigator Note:   58 year old female with hx of chronic systolic heart failure,  HTN, stroke, HLD, chronic tobacco dependence, CAD/stent who presented to the ER with chest pain.  Patient found to have STEMI.  Patient underwent Cardiac Cath.    03/16/2018:   Coronary/Graft Acute MI Revascularization  LEFT HEART CATH AND CORONARY ANGIOGRAPHY  Conclusion     Dist RCA lesion is 100% stenosed.  Mid RCA lesion is 70% stenosed.  Ost RPDA lesion is 75% stenosed.  A drug-eluting stent was successfully placed using a STENT RESOLUTE ONYX 2.25X18.  Post intervention, there is a 0% residual stenosis.  A drug-eluting stent was successfully placed using a STENT SYNERGY DES 2.25X16.  A drug-eluting stent was successfully placed using a STENT RESOLUTE ONYX [id number].  Post intervention, there is a 0% residual stenosis.  Prox LAD to Mid LAD lesion is 30% stenosed.   1.  Inferior STEMI 2.  Occluded distal RCA 3.  Patent stent mid LAD 4.  Severe dilated cardiomyopathy with LVEF 25 to 30% 5.  Successful primary PCI with overlapping DES mid and distal RCA   EDUCATION:   "Heart Attack Bouncing Back" booklet given and reviewed with patient. Discussed the definition of CAD. Reviewed the location of CAD and where her stents were placed. Informed patient she will be given stent cards. Explained the purpose of the stent card. Instructed patient to keep stent card in her wallet.  ? Discussed modifiable risk factors including controlling blood pressure, cholesterol, and blood sugar; following heart healthy diet; maintaining healthy weight; exercise; and smoking cessation.  ? Discussed cardiac medications including rationale for taking, mechanisms of action, and side effects. Stressed the importance of taking medications as prescribed. Initially patient stated, "Oh I always take my medications like I'm supposed to do so."  Then patient becomes emotional and states, "I  haven't been able to take any medications for 6 years."  Reported this to [facility].  RNCM to assist patient with getting medications.    ? Discussed emergency plan for heart attack symptoms. Patient verbalized understanding of need to call 911 and not to drive herself to ER if having cardiac symptoms / chest pain.  ? Heart healthy diet of low sodium, low fat, low cholesterol heart healthy diet discussed. Information on diet provided. Note: Diet education has been completed by Dietitian.  ? Smoking Cessation - Patient is a current every day smoker. Patient reports smoking 1/2 pack per day prior to admission.  Patient stated she smokes because this helps to relieve her stress.  Patient has been cmoking since the age of 62. Patient has never tired to quit. "Thinking about Quitting - Yes You Can!" informational sheet reviewed with patient.  During our discussion there was no indication patient is ready to quit at this time.   ? Exercise - Benefits of exercised discussed.   Patient informed this RN that she walks at home.  Prior to admission patient was experiencing SOB when walking.  Informed patient that his cardiologist has referred him to outpatient Cardiac Rehab. An overview of the program was provided. Patient does not have any payor source at present.  Patient is re-applying for Medicaid.  Financial counselor has been in to talk with her and started the application process.  Patient stated, "I have had Medicaid in the past, but lost it."  Informed patient if Medicaid is approved then Cardiac Rehab would be covered at 100%. Referral deferred for 30 days.  Cardiac Rehab  dept will follow-up with patient in 30 days. Cardiac Rehab brochure given to patient.   NOTE:  Patient does not drive and depends on her daughter for transportation.    Patient appreciative of the information.  ? Roanna Epley, RN, BSN, [place]  [facility] Cardiac & Pulmonary Rehab  Cardiovascular & Pulmonary Nurse Navigator  Direct  Line: [phone]  Department Phone #: [phone] Fax: [phone]  Email Address: [email or web address]

## 2018-03-19 NOTE — Progress Notes (Signed)
IV and tele removed from patient. Discharge instructions given to patient along with hard copy prescriptions. Verbalized understanding. No acute distress at this time. Daughter to transport patient home.

## 2018-03-19 NOTE — Plan of Care (Signed)
Nutrition Education Note  RD consulted for nutrition education regarding a Heart Healthy diet.   Lipid Panel     Component Value Date/Time   CHOL 203 (H) 03/16/2018 1726   TRIG 168 (H) 03/16/2018 1726   HDL 49 03/16/2018 1726   CHOLHDL 4.1 03/16/2018 1726   VLDL 34 03/16/2018 1726   LDLCALC 120 (H) 03/16/2018 1726    RD provided "Heart Healthy Nutrition Therapy" handout from the Academy of Nutrition and Dietetics. Reviewed patient's dietary recall. Provided examples on ways to decrease sodium and fat intake in diet. Discouraged intake of processed foods and use of salt shaker. Encouraged fresh fruits and vegetables as well as whole grain sources of carbohydrates to maximize fiber intake. Teach back method used. Educated patient on how to decrease sodium content of canned vegetables by rinsing prior to cooking. Provided patient with nutrition fact label handout to assist in choosing healthier options.   Expect poor compliance. Patient reports being food insecure and 1 meal/day on average.   Body mass index is 29.15 kg/m. Pt meets criteria for overweight based on current BMI.  Current diet order is heart healthy, patient is consuming approximately 80% of meals at this time. Labs and medications reviewed. No further nutrition interventions warranted at this time. RD contact information provided. If additional nutrition issues arise, please re-consult RD.  Lajuan Lines, RD, LDN  After Hours/Weekend Pager: [phone]

## 2018-03-19 NOTE — Care Management Note (Signed)
Case Management Note  Patient Details  Name: Brianna Roth MRN: [id number] Date of Birth: 28-Oct-1960  Subjective/Objective:     Faxing all prescriptions to [facility].  Patient understands to take ODC/MMC application to [facility].  States her daughter will take her there at discharge.                 Action/Plan:   Expected Discharge Date:  03/19/18               Expected Discharge Plan:  Home/Self Care  In-House Referral:     Discharge planning Services  CM Consult, [facility], Medication Assistance  Post Acute Care Choice:    Choice offered to:  Patient, Spouse  DME Arranged:    DME Agency:     HH Arranged:    [place] Agency:     Status of Service:  Completed, signed off  If discussed at [employer] of Stay Meetings, dates discussed:    Additional Comments:  Elza Rafter, RN 03/19/2018, 12:27 PM

## 2018-03-24 ENCOUNTER — Telehealth: Payer: Self-pay

## 2018-03-24 NOTE — Telephone Encounter (Signed)
Flagged on EMMI report for having unfilled prescriptions.  First attempt to reach patient made, however unable to reach patient.  Left voicemail encouraging callback. Will attempt at later time.    

## 2018-03-25 NOTE — Telephone Encounter (Signed)
Reached upon second attempt.  She confirmed she was able to fill her prescriptions with assistance of [facility].

## 2018-05-01 ENCOUNTER — Ambulatory Visit: Payer: Self-pay | Admitting: Pharmacy Technician

## 2018-05-01 ENCOUNTER — Ambulatory Visit: Payer: Self-pay | Admitting: Pharmacist

## 2018-05-01 ENCOUNTER — Other Ambulatory Visit: Payer: Self-pay

## 2018-05-01 NOTE — Progress Notes (Signed)
Provided patient information about how to apply for charity care for [facility].  Completed [facility] application and contract.  Patient agreed to all terms of the [facility] contract.    Patient approved to receive medication assistance at [facility] as long as eligibility criteria continues to be met.    Provided patient with community resource material based on her particular needs.    Referred patient for MTM.  Referred patient to [facility].  Patient stated that she preferred to continue seeing Dr. Clayborn Bigness.  [place] [facility]

## 2018-05-01 NOTE — Progress Notes (Signed)
  [facility] Visit Note  Patient: Brianna Roth MRN: [id number] Date of Birth: Aug 11, 1960 PCP: Patient, No Pcp Per   Brianna Roth 58 y.o. female presents for a Medication Management visit today.  There were no vitals taken for this visit.  Patient Information   Past Medical History:  Diagnosis Date  . Asthma   . Cancer ([place])   . Chronic systolic heart failure (HCC)    Due to postinfarct cardiomyopathy. Ejection fraction was 30% in 2010 with anterior wall akinesis  . Coronary artery disease 05/2008   Acute anterior ST elevation myocardial infarction . Delayed intervention [facility] due to mental status changes and suspected stroke. MRI showed old strokes. Cardiac cath showed an occluded mid LAD with 50% mid RCA stenosis and mild left circumflex disease. She had thrombectomy and bare-metal stent placement to the mid LAD (2.75 x 24 mm). Ejection fraction was 25%  . Hyperlipidemia   . Hypertension   . MI (myocardial infarction) ([place])   . Stroke [facility])    Noted on MRI in 2010  . Tobacco abuse       Past Surgical History:  Procedure Laterality Date  . BACK SURGERY    . CARDIAC CATHETERIZATION  2010   [facility]  . CORONARY ANGIOPLASTY    . CORONARY/GRAFT ACUTE MI REVASCULARIZATION N/A 03/16/2018   Procedure: Coronary/Graft Acute MI Revascularization;  Surgeon: Isaias Cowman, MD;  Location: [place] CV LAB;  Service: Cardiovascular;  Laterality: N/A;  . LEFT HEART CATH AND CORONARY ANGIOGRAPHY N/A 03/16/2018   Procedure: LEFT HEART CATH AND CORONARY ANGIOGRAPHY;  Surgeon: Isaias Cowman, MD;  Location: [place] CV LAB;  Service: Cardiovascular;  Laterality: N/A;  . THROAT SURGERY    . THYROIDECTOMY      No family history on file.  New Diagnoses (since last visit):   Family Support: Good    Social History   Substance and Sexual Activity  Alcohol Use No      Social History   Tobacco Use  Smoking Status Current Every Day Smoker   . Packs/day: 0.25  . Years: 25.00  . Pack years: 6.25  . Types: Cigarettes  Smokeless Tobacco Never Used      Health Maintenance  Topic Date Due  . Hepatitis C Screening  1960/10/04  . HIV Screening  01/17/1976  . TETANUS/TDAP  01/17/1980  . PAP SMEAR-Modifier  01/16/1982  . MAMMOGRAM  01/17/2011  . COLONOSCOPY  01/17/2011  . INFLUENZA VACCINE  09/12/2017     Assessment and Plan: 1. Hypertension/STEMI (03/2018) (aspirin, clopidogrel, lisinopril, metoprolol, spironolactone, nitroglycerin): went through and discussed indication for each medication. Patient states compliance with all. Discussed nitroglycerin use as needed for chest pain and when to go to ER. Patient aware and has not needed nitroglycerin.  2. Hyperlipidemia (Atorvastatin): no issues  3. Smoking Cessation: patient has reduced cigarette use, plans to quit on her on with this method. Offered nicotine replacement assistance, but patient does not want at this time.  Patient endorses compliance with medications. No current issues at this time.  Paticia Stack, PharmD Pharmacy Resident  05/01/2018 3:54 PM

## 2019-04-15 ENCOUNTER — Other Ambulatory Visit: Payer: Self-pay | Admitting: Internal Medicine

## 2019-04-15 DIAGNOSIS — I208 Other forms of angina pectoris: Secondary | ICD-10-CM

## 2019-04-15 DIAGNOSIS — R0602 Shortness of breath: Secondary | ICD-10-CM

## 2019-05-04 ENCOUNTER — Ambulatory Visit
Admission: RE | Admit: 2019-05-04 | Discharge: 2019-05-04 | Disposition: A | Discharge status: Home / Self Care | Payer: Medicaid Other | Source: Ambulatory Visit | Attending: Internal Medicine | Admitting: Internal Medicine

## 2019-05-04 ENCOUNTER — Other Ambulatory Visit: Payer: Self-pay

## 2019-05-04 DIAGNOSIS — I5022 Chronic systolic (congestive) heart failure: Secondary | ICD-10-CM | POA: Insufficient documentation

## 2019-05-04 DIAGNOSIS — I208 Other forms of angina pectoris: Secondary | ICD-10-CM

## 2019-05-04 DIAGNOSIS — F17210 Nicotine dependence, cigarettes, uncomplicated: Secondary | ICD-10-CM | POA: Insufficient documentation

## 2019-05-04 DIAGNOSIS — I252 Old myocardial infarction: Secondary | ICD-10-CM | POA: Diagnosis not present

## 2019-05-04 DIAGNOSIS — I110 Hypertensive heart disease with heart failure: Secondary | ICD-10-CM | POA: Diagnosis not present

## 2019-05-04 DIAGNOSIS — Z8673 Personal history of transient ischemic attack (TIA), and cerebral infarction without residual deficits: Secondary | ICD-10-CM | POA: Insufficient documentation

## 2019-05-04 DIAGNOSIS — R0602 Shortness of breath: Secondary | ICD-10-CM

## 2019-05-04 LAB — NM MYOCAR MULTI W/SPECT W/WALL MOTION / EF
Estimated workload: 1 METS
Exercise duration (min): 1 min
Exercise duration (sec): 0 s
LV dias vol: 247 mL (ref 46–106)
LV sys vol: 201 mL
Peak HR: 120 {beats}/min
Percent HR: 74 %
Rest HR: 50 {beats}/min
SDS: 3
SRS: 30
SSS: 26
TID: 1.13

## 2019-05-04 MED ORDER — TECHNETIUM TC 99M TETROFOSMIN IV KIT
10.3800 | PACK | Freq: Once | INTRAVENOUS | Status: AC | PRN
  Administered 2019-05-04: 10.38 via INTRAVENOUS

## 2019-05-04 MED ORDER — TECHNETIUM TC 99M TETROFOSMIN IV KIT
30.0000 | PACK | Freq: Once | INTRAVENOUS | Status: AC | PRN
  Administered 2019-05-04: 32.65 via INTRAVENOUS

## 2019-05-04 MED ORDER — REGADENOSON 0.4 MG/5ML IV SOLN
0.4000 mg | Freq: Once | INTRAVENOUS | Status: AC
  Administered 2019-05-04: 0.4 mg via INTRAVENOUS
  Filled 2019-05-04: qty 5

## 2019-05-04 NOTE — Progress Notes (Signed)
*  PRELIMINARY RESULTS* Echocardiogram 2D Echocardiogram has been performed.  Brianna Roth 05/04/2019, 11:18 AM

## 2019-07-31 ENCOUNTER — Emergency Department: Payer: Medicaid Other

## 2019-07-31 ENCOUNTER — Emergency Department
Admission: EM | Admit: 2019-07-31 | Discharge: 2019-07-31 | Disposition: A | Discharge status: Home / Self Care | Payer: Medicaid Other | Attending: Emergency Medicine | Admitting: Emergency Medicine

## 2019-07-31 ENCOUNTER — Encounter: Payer: Self-pay | Admitting: Emergency Medicine

## 2019-07-31 ENCOUNTER — Other Ambulatory Visit: Payer: Self-pay

## 2019-07-31 DIAGNOSIS — Z7982 Long term (current) use of aspirin: Secondary | ICD-10-CM | POA: Insufficient documentation

## 2019-07-31 DIAGNOSIS — Z7901 Long term (current) use of anticoagulants: Secondary | ICD-10-CM | POA: Diagnosis not present

## 2019-07-31 DIAGNOSIS — I2510 Atherosclerotic heart disease of native coronary artery without angina pectoris: Secondary | ICD-10-CM | POA: Insufficient documentation

## 2019-07-31 DIAGNOSIS — I5022 Chronic systolic (congestive) heart failure: Secondary | ICD-10-CM | POA: Diagnosis not present

## 2019-07-31 DIAGNOSIS — J45909 Unspecified asthma, uncomplicated: Secondary | ICD-10-CM | POA: Diagnosis not present

## 2019-07-31 DIAGNOSIS — F17210 Nicotine dependence, cigarettes, uncomplicated: Secondary | ICD-10-CM | POA: Insufficient documentation

## 2019-07-31 DIAGNOSIS — Z79899 Other long term (current) drug therapy: Secondary | ICD-10-CM | POA: Insufficient documentation

## 2019-07-31 DIAGNOSIS — I110 Hypertensive heart disease with heart failure: Secondary | ICD-10-CM | POA: Diagnosis not present

## 2019-07-31 DIAGNOSIS — R590 Localized enlarged lymph nodes: Secondary | ICD-10-CM | POA: Diagnosis not present

## 2019-07-31 DIAGNOSIS — R221 Localized swelling, mass and lump, neck: Secondary | ICD-10-CM | POA: Diagnosis present

## 2019-07-31 LAB — CBC WITH DIFFERENTIAL/PLATELET
Abs Immature Granulocytes: 0.09 10*3/uL — ABNORMAL HIGH (ref 0.00–0.07)
Basophils Absolute: 0.1 10*3/uL (ref 0.0–0.1)
Basophils Relative: 1 %
Eosinophils Absolute: 0 10*3/uL (ref 0.0–0.5)
Eosinophils Relative: 0 %
HCT: 40 % (ref 36.0–46.0)
Hemoglobin: 14.1 g/dL (ref 12.0–15.0)
Immature Granulocytes: 1 %
Lymphocytes Relative: 11 %
Lymphs Abs: 1.6 10*3/uL (ref 0.7–4.0)
MCH: 35 pg — ABNORMAL HIGH (ref 26.0–34.0)
MCHC: 35.3 g/dL (ref 30.0–36.0)
MCV: 99.3 fL (ref 80.0–100.0)
Monocytes Absolute: 1 10*3/uL (ref 0.1–1.0)
Monocytes Relative: 7 %
Neutro Abs: 11.8 10*3/uL — ABNORMAL HIGH (ref 1.7–7.7)
Neutrophils Relative %: 80 %
Platelets: 271 10*3/uL (ref 150–400)
RBC: 4.03 MIL/uL (ref 3.87–5.11)
RDW: 11.9 % (ref 11.5–15.5)
WBC: 14.5 10*3/uL — ABNORMAL HIGH (ref 4.0–10.5)
nRBC: 0 % (ref 0.0–0.2)

## 2019-07-31 LAB — COMPREHENSIVE METABOLIC PANEL
ALT: 12 U/L (ref 0–44)
AST: 15 U/L (ref 15–41)
Albumin: 3.8 g/dL (ref 3.5–5.0)
Alkaline Phosphatase: 98 U/L (ref 38–126)
Anion gap: 10 (ref 5–15)
BUN: 13 mg/dL (ref 6–20)
CO2: 24 mmol/L (ref 22–32)
Calcium: 9.6 mg/dL (ref 8.9–10.3)
Chloride: 101 mmol/L (ref 98–111)
Creatinine, Ser: 0.77 mg/dL (ref 0.44–1.00)
GFR calc Af Amer: >60 mL/min (ref >60)
GFR calc non Af Amer: >60 mL/min (ref >60)
Glucose, Bld: 111 mg/dL — ABNORMAL HIGH (ref 70–99)
Potassium: 4 mmol/L (ref 3.5–5.1)
Sodium: 135 mmol/L (ref 135–145)
Total Bilirubin: 1.3 mg/dL — ABNORMAL HIGH (ref 0.3–1.2)
Total Protein: 8 g/dL (ref 6.5–8.1)

## 2019-07-31 MED ORDER — AMOXICILLIN-POT CLAVULANATE 400-57 MG/5ML PO SUSR
875.0000 mg | Freq: Two times a day (BID) | ORAL | Status: DC
  Administered 2019-07-31: 875 mg via ORAL
  Filled 2019-07-31 (×2): qty 10.9

## 2019-07-31 MED ORDER — IOHEXOL 300 MG/ML  SOLN
75.0000 mL | Freq: Once | INTRAMUSCULAR | Status: AC | PRN
  Administered 2019-07-31: 75 mL via INTRAVENOUS
  Filled 2019-07-31: qty 75

## 2019-07-31 MED ORDER — OXYCODONE-ACETAMINOPHEN 5-325 MG PO TABS
1.0000 | ORAL_TABLET | Freq: Once | ORAL | Status: AC
  Administered 2019-07-31: 1 via ORAL
  Filled 2019-07-31: qty 1

## 2019-07-31 MED ORDER — AMOXICILLIN-POT CLAVULANATE 400-57 MG/5ML PO SUSR: 875.0000 mg | Freq: Two times a day (BID) | ORAL | 0 refills | Status: AC

## 2019-07-31 MED ORDER — OXYCODONE-ACETAMINOPHEN 5-325 MG PO TABS: 1.0000 | ORAL_TABLET | Freq: Four times a day (QID) | ORAL | 0 refills | Status: DC | PRN

## 2019-07-31 NOTE — Discharge Instructions (Addendum)
It is extremely important that you call on Monday morning to make an appointment to follow-up with Dr. Kathyrn Sheriff who is a ear nose and throat specialist.  His phone number and address is listed on your discharge papers.  Begin taking Augmentin twice a day for the next 10 days.  A prescription for pain medication was sent to your pharmacy as well if needed.  Dr. Kathyrn Sheriff plans to see you in the office where he can reevaluate the area on your neck and do other tests.

## 2019-07-31 NOTE — ED Provider Notes (Signed)
[facility] Emergency Department Provider Note  ____________________________________________   First MD Initiated Contact with Patient 07/31/19 1115     (approximate)  I have reviewed the triage vital signs and the nursing notes.   HISTORY  Chief Complaint Lymphadenopathy   HPI Brianna Roth is a 59 y.o. female presents to the ED with complaint of swelling to the right side of her neck x1 week.  Patient denies any knowledge of insect or spider bites.  Patient states that it does itch at times.  Patient denies any fever, chills, nausea or vomiting.  She denies any difficulty talking, breathing or swallowing.  Currently she smokes 1 &  1/2 pack cigarettes per day.  She rates her pain as a 10/10.      Past Medical History:  Diagnosis Date  . Asthma   . Cancer ([place])   . Chronic systolic heart failure (HCC)    Due to postinfarct cardiomyopathy. Ejection fraction was 30% in 2010 with anterior wall akinesis  . Coronary artery disease 05/2008   Acute anterior ST elevation myocardial infarction . Delayed intervention at [facility] due to mental status changes and suspected stroke. MRI showed old strokes. Cardiac cath showed an occluded mid LAD with 50% mid RCA stenosis and mild left circumflex disease. She had thrombectomy and bare-metal stent placement to the mid LAD (2.75 x 24 mm). Ejection fraction was 25%  . Hyperlipidemia   . Hypertension   . MI (myocardial infarction) ([place])   . Stroke [facility])    Noted on MRI in 2010  . Tobacco abuse     Patient Active Problem List   Diagnosis Date Noted  . ST elevation myocardial infarction (STEMI) ([place]) 03/16/2018  . Ischemic cardiomyopathy   . Essential hypertension   . 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 Coronary artery disease   . Hyperlipidemia   . Chronic systolic heart failure ([place])   . Tobacco abuse     Past Surgical History:  Procedure Laterality Date  . BACK SURGERY    . CARDIAC  CATHETERIZATION  2010   [facility]  . CORONARY ANGIOPLASTY    . CORONARY/GRAFT ACUTE MI REVASCULARIZATION N/A 03/16/2018   Procedure: Coronary/Graft Acute MI Revascularization;  Surgeon: Isaias Cowman, MD;  Location: [place] [facility];  Service: Cardiovascular;  Laterality: N/A;  . LEFT HEART CATH AND CORONARY ANGIOGRAPHY N/A 03/16/2018   Procedure: LEFT HEART CATH AND CORONARY ANGIOGRAPHY;  Surgeon: Isaias Cowman, MD;  Location: [place] CV [facility];  Service: Cardiovascular;  Laterality: N/A;  . THROAT SURGERY    . THYROIDECTOMY      Prior to Admission medications   Medication Sig Start Date End Date Taking? Authorizing Provider  amoxicillin-clavulanate (AUGMENTIN) 400-57 MG/5ML suspension Take 10.9 mLs (875 mg total) by mouth 2 (two) times daily for 10 days. 07/31/19 08/10/19  Johnn Hai, PA-C  aspirin 81 MG chewable tablet Chew 1 tablet (81 mg total) by mouth daily. 03/20/18   Salary, Avel Peace, MD  atorvastatin (LIPITOR) 40 MG tablet Take 1 tablet (40 mg total) by mouth daily at 6 PM. 03/19/18   Salary, Holly Bodily D, MD  clopidogrel (PLAVIX) 75 MG tablet Take 1 tablet (75 mg total) by mouth daily. 03/20/18   Salary, Avel Peace, MD  gabapentin (NEURONTIN) 300 MG capsule Take 1 capsule (300 mg total) by mouth 2 (two) times daily. 03/19/18   Salary, Avel Peace, MD  lisinopril (PRINIVIL,ZESTRIL) 5 MG tablet Take 1 tablet (5 mg total) by  mouth daily. 03/19/18   Salary, Avel Peace, MD  metoprolol succinate (TOPROL XL) 25 MG 24 hr tablet Take 1 tablet (25 mg total) by mouth daily. 03/20/18   Salary, Avel Peace, MD  nitroGLYCERIN (NITROSTAT) 0.4 MG SL tablet Place 1 tablet (0.4 mg total) under the tongue every 5 (five) minutes as needed for chest pain. 03/19/18   Salary, Avel Peace, MD  oxyCODONE-acetaminophen (PERCOCET) 5-325 MG tablet Take 1 tablet by mouth every 6 (six) hours as needed for severe pain. 07/31/19   Johnn Hai, PA-C  spironolactone (ALDACTONE) 25 MG tablet Take 1 tablet (25 mg total)  by mouth daily. 03/20/18   Salary, Avel Peace, MD    Allergies Patient has no known allergies.  History reviewed. No pertinent family history.  Social History Social History   Tobacco Use  . Smoking status: Current Every Day Smoker    Packs/day: 0.25    Years: 25.00    Pack years: 6.25    Types: Cigarettes  . Smokeless tobacco: Never Used  Substance Use Topics  . Alcohol use: No  . Drug use: No    Review of Systems Constitutional: No fever/chills Eyes: No visual changes. ENT: No sore throat. Cardiovascular: Denies chest pain. Respiratory: Denies shortness of breath. Gastrointestinal:   No nausea, no vomiting.   Musculoskeletal: Negative for back pain. Skin: Question infection right neck. Neurological: Negative for headaches, focal weakness or numbness. Hematological/Lymphatic:  Positive right neck lymphadenopathy  ____________________________________________   PHYSICAL EXAM:  VITAL SIGNS: ED Triage Vitals  Enc Vitals Group     BP 07/31/19 1049 132/80     Pulse Rate 07/31/19 1049 (!) 109     Resp 07/31/19 1049 16     Temp 07/31/19 1049 98.9 F (37.2 C)     Temp Source 07/31/19 1049 Oral     SpO2 07/31/19 1049 98 %     Weight 07/31/19 1050 128 lb (58.1 kg)     Height 07/31/19 1050 5\' 2"  (1.575 m)     Head Circumference --      Peak Flow --      Pain Score 07/31/19 1050 10     Pain Loc --      Pain Edu? --      Excl. in [place]? --     Constitutional: Alert and oriented. Well appearing and in no acute distress. Eyes: Conjunctivae are normal. PERRL. EOMI. Head: Atraumatic. Nose: No congestion/rhinnorhea. Mouth/Throat: Mucous membranes are moist.  Oropharynx non-erythematous.  Poor dentition and oral care. Neck: No stridor.   Large firm mass noted on palpation of the right lateral neck.  Nontender cervical spine palpation posteriorly. Hematological/Lymphatic/Immunilogical: Right lateral cervical lymphadenopathy with questionable mass as noted  above.. Cardiovascular: Normal rate, regular rhythm. Grossly normal heart sounds.  Good peripheral circulation. Respiratory: Normal respiratory effort.  No retractions. Lungs CTAB. Musculoskeletal: Moves upper and lower extremities with any difficulty and patient is ambulatory without any assistance. Neurologic:  Normal speech and language. No gross focal neurologic deficits are appreciated. No gait instability. Skin:  Skin is warm, dry and intact.  Mild erythema noted right lateral neck. Psychiatric: Mood and affect are normal. Speech and behavior are normal.  ____________________________________________   LABS (all labs ordered are listed, but only abnormal results are displayed)  Labs Reviewed  CBC WITH DIFFERENTIAL/PLATELET - Abnormal; Notable for the following components:      Result Value   WBC 14.5 (*)    MCH 35.0 (*)    Neutro Abs 11.8 (*)  Abs Immature Granulocytes 0.09 (*)    All other components within normal limits  COMPREHENSIVE METABOLIC PANEL - Abnormal; Notable for the following components:   Glucose, Bld 111 (*)    Total Bilirubin 1.3 (*)    All other components within normal limits    RADIOLOGY  Official radiology report(s): CT Soft Tissue Neck W Contrast  Result Date: 07/31/2019 CLINICAL DATA:  Right-sided neck mass which has increased over time. EXAM: CT NECK WITH CONTRAST TECHNIQUE: Multidetector CT imaging of the neck was performed using the standard protocol following the bolus administration of intravenous contrast. CONTRAST:  62mL OMNIPAQUE IOHEXOL 300 MG/ML  SOLN COMPARISON:  None. FINDINGS: Pharynx and larynx: Definite mucosal or submucosal lesions are present. Nasopharynx is clear. There is some fullness of the tonsils bilaterally without a discrete mass. Base is within normal limits. Epiglottis is. The hypopharynx is clear. Asymmetry may represent a focal lesion along the left aryepiglottic fold measuring 8.5 x 6.5 by 9.5 mm. Definite right-sided lesions  are present. Vocal cords are midline and symmetric. Trachea is within normal limits. Salivary glands: The submandibular and parotid glands and ducts are within normal limits. Thyroid: Heterogeneous thyroid is present. Multiple small nodules are present. No dominant nodule is present. Lymph nodes: Peripherally enhancing area along the anterior margin of the right sternocleidomastoid muscle measures 5.2 x 2.8 by 3.4 cm. Is concerning for necrotic lymph node mass. Other subcentimeter submandibular and level 2 lymph nodes are present. Right jugulodigastric lymph node measures 13 x 21 x 9 mm. Pathologically enlarged nodes are present on the left. Vascular: Atherosclerotic changes are present in the distal common carotid arteries and bifurcations bilaterally. Stenosis of up 50% is present on the left. No significant stenosis is present on right. Vascular calcifications are present at the aortic arch and great vessel origins without a significant stenosis or aneurysm. Limited intracranial: Within normal limits Visualized orbits: The globes and orbits are within normal limits. Mastoids and visualized paranasal sinuses: The paranasal sinuses and mastoid air cells are clear. Skeleton: Multilevel endplate degenerative changes are present in the cervical spine. These are most evident at C5-6 and C6-7. Upper chest: Centrilobular emphysematous changes are present. No nodule or mass lesion present. Thoracic inlet is normal. IMPRESSION: 1. 5.2 x 2.8 by 3.4 cm peripherally enhancing area along the anterior margin of the right sternocleidomastoid muscle is concerning for necrotic lymph node mass mass. Infection is considered less likely. No definite primary lesion is identified on the right. 2. Pathologically enlarged right jugulodigastric lymph node is concerning for metastatic disease. 3. Asymmetry may represent a focal lesion along the left aryepiglottic fold measuring 8.5 x 6.5 by 9.5 mm. Recommend direct visualization. 4.  Heterogeneous thyroid with multiple small nodules. No dominant nodule is present. Not clinically significant; no follow-up imaging recommended (ref: J Am Coll Radiol. 2015 Feb;12(2): 143-50). 5. Multilevel degenerative changes of the cervical spine are most evident at C5-6 and C6-7. 6. Aortic Atherosclerosis (ICD10-I70.0) and Emphysema (ICD10-J43.9). Electronically Signed   By: San Morelle M.D.   On: 07/31/2019 13:40    ____________________________________________   PROCEDURES  Procedure(s) performed (including Critical Care):  Procedures   ____________________________________________   INITIAL IMPRESSION / ASSESSMENT AND PLAN / ED COURSE  As part of my medical decision making, I reviewed the following data within the electronic MEDICAL RECORD NUMBER Notes from prior ED visits and [place] Controlled Substance Database  59 year old female presents to the ED with complaint of right-sided neck swelling for approximately 1 week.  Patient  states it started with a small knot and got bigger.  She denies any fever, chills, nausea or vomiting.  There has been no difficulty swallowing, talking or breathing.  On exam there is a very large, firm and markedly tender nodule to the right lateral neck.  No papules, vesicles or open skin is noted in this area.  Remainder of her exam is benign with the exception of poor dentition and oral hygiene.  CT scan shows an area measuring 5.2 x 2.8 by 3.4 centimeter area with a concern for a necrotic area within this area.  Also there was a large jugulodigastric lymph node measuring 13 x 21 x 9 mm.  This case was discussed with Dr. Ladene Artist who is the ENT on-call for [facility] today.  He advised antibiotics and for her to be seen in the office the first next week.  This was strongly emphasized to the patient that she needs to call on Monday to make arrangements to be seen.  Her first dose of antibiotics was given to her while in the ED.  Patient was given Augmentin  suspension as she states that she has a long life problem with large tablets and not because of her throat.  She was able to swallow the Percocet without any difficulty.  Both medications were sent to her pharmacy.  She is told to return to the emergency department if any worsening of her symptoms over the weekend.   ____________________________________________   FINAL CLINICAL IMPRESSION(S) / ED DIAGNOSES  Final diagnoses:  Lymphadenopathy of right cervical region     ED Discharge Orders         Ordered    amoxicillin-clavulanate (AUGMENTIN) 400-57 MG/5ML suspension  2 times daily     Discontinue  Reprint     07/31/19 1622    oxyCODONE-acetaminophen (PERCOCET) 5-325 MG tablet  Every 6 hours PRN     Discontinue  Reprint     07/31/19 1622           Note:  This document was prepared using Dragon voice recognition software and may include unintentional dictation errors.    Johnn Hai, PA-C 07/31/19 1753    Duffy Bruce, MD 08/03/19 [phone]

## 2019-07-31 NOTE — ED Triage Notes (Signed)
Pt to ED via POV c/o swelling in the right side of her neck. Pt states that it started out as a small knot and has gotten bigger. Pt states that the area has been there for about 1 week. Pt is in NAD.

## 2022-08-22 ENCOUNTER — Ambulatory Visit: Payer: Medicaid Other | Admitting: Family Medicine

## 2023-03-05 ENCOUNTER — Encounter (INDEPENDENT_AMBULATORY_CARE_PROVIDER_SITE_OTHER): Payer: Self-pay

## 2023-04-26 ENCOUNTER — Other Ambulatory Visit (INDEPENDENT_AMBULATORY_CARE_PROVIDER_SITE_OTHER): Payer: Self-pay | Admitting: Nurse Practitioner

## 2023-04-26 DIAGNOSIS — I739 Peripheral vascular disease, unspecified: Secondary | ICD-10-CM

## 2023-04-29 ENCOUNTER — Encounter (INDEPENDENT_AMBULATORY_CARE_PROVIDER_SITE_OTHER): Payer: Self-pay | Admitting: Nurse Practitioner

## 2023-04-29 ENCOUNTER — Encounter (INDEPENDENT_AMBULATORY_CARE_PROVIDER_SITE_OTHER): Payer: Self-pay

## 2023-05-01 ENCOUNTER — Encounter (INDEPENDENT_AMBULATORY_CARE_PROVIDER_SITE_OTHER): Payer: Self-pay | Admitting: Nurse Practitioner

## 2023-05-01 ENCOUNTER — Ambulatory Visit (INDEPENDENT_AMBULATORY_CARE_PROVIDER_SITE_OTHER): Admitting: Nurse Practitioner

## 2023-05-01 ENCOUNTER — Ambulatory Visit (INDEPENDENT_AMBULATORY_CARE_PROVIDER_SITE_OTHER): Payer: Self-pay

## 2023-05-01 VITALS — BP 117/74 | HR 81 | Resp 16 | Wt 129.4 lb

## 2023-05-01 DIAGNOSIS — E785 Hyperlipidemia, unspecified: Secondary | ICD-10-CM | POA: Diagnosis not present

## 2023-05-01 DIAGNOSIS — I739 Peripheral vascular disease, unspecified: Secondary | ICD-10-CM | POA: Diagnosis not present

## 2023-05-01 DIAGNOSIS — I70223 Atherosclerosis of native arteries of extremities with rest pain, bilateral legs: Secondary | ICD-10-CM | POA: Diagnosis not present

## 2023-05-01 DIAGNOSIS — I10 Essential (primary) hypertension: Secondary | ICD-10-CM | POA: Diagnosis not present

## 2023-05-01 DIAGNOSIS — Z720 Tobacco use: Secondary | ICD-10-CM

## 2023-05-02 ENCOUNTER — Encounter (INDEPENDENT_AMBULATORY_CARE_PROVIDER_SITE_OTHER): Payer: Self-pay | Admitting: Nurse Practitioner

## 2023-05-02 NOTE — Progress Notes (Signed)
 Subjective:    Patient ID: Brianna Roth, female    DOB: Apr 05, 1960, 63 y.o.   MRN: [id number] Chief Complaint  Patient presents with   New Patient (Initial Visit)    Ref [facility] consult claudication      The patient is seen for evaluation of painful lower extremities and diminished pulses. Patient notes the pain is always associated with activity and is very consistent day today. Typically, the pain occurs at less than one block, progress is as activity continues to the point that the patient must stop walking. Resting including standing still for several minutes allows the patient to walk a similar distance before being forced to stop again. Uneven terrain and inclines shorten the distance. The pain has been progressive over the past several years. The patient denies any abrupt changes in claudication symptoms.  The patient states the inability to walk is causing problems with daily activities.  The patient  endorses rest pain or dangling of an extremity off the side of the bed during the night for relief. No open wounds or sores at this time. The patient notes that she has a stent in her right leg when I am unable to find record.  She has had several heart catheterizations.  No history of back problems or DJD of the lumbar sacral spine.   The patient's blood pressure has been stable and relatively well controlled. The patient denies amaurosis fugax or recent TIA symptoms. There are no recent neurological changes noted. The patient denies history of DVT, PE or superficial thrombophlebitis. The patient denies recent episodes of angina or shortness of breath.   Today she has a right ABI of 0.57 and a left of 0.59.  She has a TBI of 0.34 on the right and 0.25 on the left.  Monophasic tibial vessel waveforms bilaterally with dampened toe waveforms.    Review of Systems  Neurological:  Positive for numbness.  All other systems reviewed and are negative.      Objective:    Physical Exam Vitals reviewed.  HENT:     Head: Normocephalic.  Cardiovascular:     Rate and Rhythm: Normal rate.     Pulses:          Dorsalis pedis pulses are detected w/ Doppler on the right side and detected w/ Doppler on the left side.       Posterior tibial pulses are detected w/ Doppler on the right side and detected w/ Doppler on the left side.  Pulmonary:     Effort: Pulmonary effort is normal.  Skin:    General: Skin is warm and dry.  Neurological:     Mental Status: She is alert and oriented to person, place, and time.     Gait: Gait abnormal.  Psychiatric:        Mood and Affect: Mood normal.        Behavior: Behavior normal.        Thought Content: Thought content normal.        Judgment: Judgment normal.     BP 117/74   Pulse 81   Resp 16   Wt 129 lb 6.4 oz (58.7 kg)   BMI 23.67 kg/m   Past Medical History:  Diagnosis Date   Asthma    Cancer (HCC)    Chronic systolic heart failure (HCC)    Due to postinfarct cardiomyopathy. Ejection fraction was 30% in 2010 with anterior wall akinesis   Coronary artery disease 05/2008   Acute anterior  ST elevation myocardial infarction . Delayed intervention at [facility] due to mental status changes and suspected stroke. MRI showed old strokes. Cardiac cath showed an occluded mid LAD with 50% mid RCA stenosis and mild left circumflex disease. She had thrombectomy and bare-metal stent placement to the mid LAD (2.75 x 24 mm). Ejection fraction was 25%   Hyperlipidemia    Hypertension    MI (myocardial infarction) (HCC)    Stroke (HCC)    Noted on MRI in 2010   Tobacco abuse     Social History   Socioeconomic History   Marital status: Single    Spouse name: Not on file   Number of children: Not on file   Years of education: Not on file   Highest education level: Not on file  Occupational History   Not on file  Tobacco Use   Smoking status: Every Day    Current packs/day: 0.25    Average packs/day: 0.3 packs/day for  25.0 years (6.3 ttl pk-yrs)    Types: Cigarettes   Smokeless tobacco: Never  Substance and Sexual Activity   Alcohol use: No   Drug use: No   Sexual activity: Not on file  Other Topics Concern   Not on file  Social History Narrative   Not on file   Social Drivers of Health   Financial Resource Strain: High Risk (03/08/2022)   [facility], [facility]   Overall Financial Resource Strain (CARDIA)    Difficulty of Paying Living Expenses: Hard  Food Insecurity: Food Insecurity Present (03/08/2022)   Received from [facility], [facility]   Hunger Vital Sign    Worried About Running Out of Food in the Last Year: Sometimes true    Ran Out of Food in the Last Year: Sometimes true  Transportation Needs: Unmet Transportation Needs (03/08/2022)   Received from [facility], [facility]   [facility] - Transportation    In the past 12 months, has lack of transportation kept you from medical appointments or from getting medications?: Yes    Lack of Transportation (Non-Medical): Yes  Physical Activity: Inactive (03/08/2022)   Received from [facility], [facility]   Exercise Vital Sign    Days of Exercise per Week: 0 days    Minutes of Exercise per Session: 0 min  Stress: Stress Concern Present (03/08/2022)   Received from [facility], [facility]   [employer] of Occupational Health - Occupational Stress Questionnaire    Feeling of Stress : Rather much  Social Connections: Moderately Isolated (03/08/2022)   Received from [facility], [facility]   Social Connection and Isolation Panel [NHANES]    Frequency of Communication with Friends and Family: More than three times a week    Frequency of Social Gatherings with Friends and Family: More than three times a week     Attends Religious Services: Never    Database administrator or Organizations: No    Attends Banker Meetings: Never    Marital Status: Living with partner  Intimate Partner Violence: Not on file    Past Surgical History:  Procedure Laterality Date   ABDOMINAL HYSTERECTOMY     BACK SURGERY     CARDIAC CATHETERIZATION  02/13/2008   [facility]   CORONARY ANGIOPLASTY     CORONARY/GRAFT ACUTE MI REVASCULARIZATION N/A 03/16/2018   Procedure: Coronary/Graft  Acute MI Revascularization;  Surgeon: Marcina Millard, MD;  Location: [facility] INVASIVE CV LAB;  Service: Cardiovascular;  Laterality: N/A;   LEFT HEART CATH AND CORONARY ANGIOGRAPHY N/A 03/16/2018   Procedure: LEFT HEART CATH AND CORONARY ANGIOGRAPHY;  Surgeon: Marcina Millard, MD;  Location: [facility] INVASIVE CV LAB;  Service: Cardiovascular;  Laterality: N/A;   THROAT SURGERY     THYROIDECTOMY      Family History  Problem Relation Age of Onset   Cancer Mother    Heart attack Father    Hypertension Daughter    Heart attack Daughter     No Known Allergies     Latest Ref Rng & Units 07/31/2019   11:41 AM 03/19/2018    5:02 AM 03/18/2018    4:03 AM  CBC  WBC 4.0 - 10.5 K/uL 14.5  12.5  11.9   Hemoglobin 12.0 - 15.0 g/dL 54.0  98.1  19.1   Hematocrit 36.0 - 46.0 % 40.0  42.7  43.7   Platelets 150 - 400 K/uL 271  211  203       CMP     Component Value Date/Time   NA 135 07/31/2019 1141   NA 138 06/20/2013 1722   K 4.0 07/31/2019 1141   K 4.7 06/20/2013 1722   CL 101 07/31/2019 1141   CL 106 06/20/2013 1722   CO2 24 07/31/2019 1141   CO2 22 06/20/2013 1722   GLUCOSE 111 (H) 07/31/2019 1141   GLUCOSE 161 (H) 06/20/2013 1722   BUN 13 07/31/2019 1141   BUN 11 06/20/2013 1722   CREATININE 0.77 07/31/2019 1141   CREATININE 0.45 (L) 06/20/2013 1722   CALCIUM 9.6 07/31/2019 1141   CALCIUM 9.3 06/20/2013 1722   PROT 8.0 07/31/2019 1141   PROT 8.3 (H) 06/20/2013 1722   ALBUMIN 3.8 07/31/2019 1141   ALBUMIN 3.7  06/20/2013 1722   AST 15 07/31/2019 1141   AST 28 06/20/2013 1722   ALT 12 07/31/2019 1141   ALT 16 06/20/2013 1722   ALKPHOS 98 07/31/2019 1141   ALKPHOS 128 (H) 06/20/2013 1722   BILITOT 1.3 (H) 07/31/2019 1141   BILITOT 0.5 06/20/2013 1722   GFRNONAA >60 07/31/2019 1141   GFRNONAA >60 06/20/2013 1722     No results found.     Assessment & Plan:   1. 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  This represents a limb threatening ischemia and places the patient at the risk for bilateral limb loss.  Patient should undergo angiography of the left then right lower extremity with the hope for intervention for limb salvage.  The risks and benefits as well as the alternative therapies was discussed in detail with the patient.  All questions were answered.  Patient agrees to proceed with  lower extremity angiography.  The patient will follow up with me in the office after the procedure.      2. Essential hypertension Continue antihypertensive medications as already ordered, these medications have been reviewed and there are no changes at this time.  3. Tobacco abuse Smoking cessation was discussed, 3-10 minutes spent on this topic specifically  4. Hyperlipidemia, unspecified hyperlipidemia type Continue statin as ordered and reviewed, no changes at this time   Current Outpatient Medications on File Prior to Visit  Medication Sig Dispense Refill   aspirin 81 MG chewable tablet Chew 1 tablet (81 mg total) by mouth daily. 180 tablet 0   atorvastatin (LIPITOR) 40 MG tablet Take 1 tablet (40 mg total) by mouth daily at 6 PM. 90 tablet 0   clopidogrel (PLAVIX) 75 MG tablet Take 1 tablet (75 mg total) by mouth daily. 90 tablet 0    gabapentin (NEURONTIN) 300 MG capsule Take 1 capsule (300 mg total) by mouth 2 (two) times daily. 60 capsule 0   lisinopril (PRINIVIL,ZESTRIL) 5 MG tablet Take 1 tablet (5 mg total) by mouth daily. 180 tablet 0   metoprolol succinate (TOPROL XL) 25 MG 24 hr tablet Take 1 tablet (25 mg total) by mouth daily. 180 tablet 0   nitroGLYCERIN (NITROSTAT) 0.4 MG SL tablet Place 1 tablet (0.4 mg total) under the tongue every 5 (five) minutes as needed for chest pain. 20 tablet 0   spironolactone (ALDACTONE) 25 MG tablet Take 1 tablet (25 mg total) by mouth daily. 30 tablet 0   oxyCODONE-acetaminophen (PERCOCET) 5-325 MG tablet Take 1 tablet by mouth every 6 (six) hours as needed for severe pain. (Patient not taking: Reported on 05/01/2023) 15 tablet 0   No current facility-administered medications on file prior to visit.    There are no Patient Instructions on file for this visit. No follow-ups on file.   Georgiana Spinner, NP

## 2023-05-02 NOTE — H&P (View-Only) (Signed)
 Subjective:    Patient ID: Brianna Roth, female    DOB: Apr 05, 1960, 63 y.o.   MRN: [id number] Chief Complaint  Patient presents with   New Patient (Initial Visit)    Ref [facility] consult claudication      The patient is seen for evaluation of painful lower extremities and diminished pulses. Patient notes the pain is always associated with activity and is very consistent day today. Typically, the pain occurs at less than one block, progress is as activity continues to the point that the patient must stop walking. Resting including standing still for several minutes allows the patient to walk a similar distance before being forced to stop again. Uneven terrain and inclines shorten the distance. The pain has been progressive over the past several years. The patient denies any abrupt changes in claudication symptoms.  The patient states the inability to walk is causing problems with daily activities.  The patient  endorses rest pain or dangling of an extremity off the side of the bed during the night for relief. No open wounds or sores at this time. The patient notes that she has a stent in her right leg when I am unable to find record.  She has had several heart catheterizations.  No history of back problems or DJD of the lumbar sacral spine.   The patient's blood pressure has been stable and relatively well controlled. The patient denies amaurosis fugax or recent TIA symptoms. There are no recent neurological changes noted. The patient denies history of DVT, PE or superficial thrombophlebitis. The patient denies recent episodes of angina or shortness of breath.   Today she has a right ABI of 0.57 and a left of 0.59.  She has a TBI of 0.34 on the right and 0.25 on the left.  Monophasic tibial vessel waveforms bilaterally with dampened toe waveforms.    Review of Systems  Neurological:  Positive for numbness.  All other systems reviewed and are negative.      Objective:    Physical Exam Vitals reviewed.  HENT:     Head: Normocephalic.  Cardiovascular:     Rate and Rhythm: Normal rate.     Pulses:          Dorsalis pedis pulses are detected w/ Doppler on the right side and detected w/ Doppler on the left side.       Posterior tibial pulses are detected w/ Doppler on the right side and detected w/ Doppler on the left side.  Pulmonary:     Effort: Pulmonary effort is normal.  Skin:    General: Skin is warm and dry.  Neurological:     Mental Status: She is alert and oriented to person, place, and time.     Gait: Gait abnormal.  Psychiatric:        Mood and Affect: Mood normal.        Behavior: Behavior normal.        Thought Content: Thought content normal.        Judgment: Judgment normal.     BP 117/74   Pulse 81   Resp 16   Wt 129 lb 6.4 oz (58.7 kg)   BMI 23.67 kg/m   Past Medical History:  Diagnosis Date   Asthma    Cancer (HCC)    Chronic systolic heart failure (HCC)    Due to postinfarct cardiomyopathy. Ejection fraction was 30% in 2010 with anterior wall akinesis   Coronary artery disease 05/2008   Acute anterior  ST elevation myocardial infarction . Delayed intervention at [facility] due to mental status changes and suspected stroke. MRI showed old strokes. Cardiac cath showed an occluded mid LAD with 50% mid RCA stenosis and mild left circumflex disease. She had thrombectomy and bare-metal stent placement to the mid LAD (2.75 x 24 mm). Ejection fraction was 25%   Hyperlipidemia    Hypertension    MI (myocardial infarction) (HCC)    Stroke (HCC)    Noted on MRI in 2010   Tobacco abuse     Social History   Socioeconomic History   Marital status: Single    Spouse name: Not on file   Number of children: Not on file   Years of education: Not on file   Highest education level: Not on file  Occupational History   Not on file  Tobacco Use   Smoking status: Every Day    Current packs/day: 0.25    Average packs/day: 0.3 packs/day for  25.0 years (6.3 ttl pk-yrs)    Types: Cigarettes   Smokeless tobacco: Never  Substance and Sexual Activity   Alcohol use: No   Drug use: No   Sexual activity: Not on file  Other Topics Concern   Not on file  Social History Narrative   Not on file   Social Drivers of Health   Financial Resource Strain: High Risk (03/08/2022)   [facility], [facility]   Overall Financial Resource Strain (CARDIA)    Difficulty of Paying Living Expenses: Hard  Food Insecurity: Food Insecurity Present (03/08/2022)   Received from [facility], [facility]   Hunger Vital Sign    Worried About Running Out of Food in the Last Year: Sometimes true    Ran Out of Food in the Last Year: Sometimes true  Transportation Needs: Unmet Transportation Needs (03/08/2022)   Received from [facility], [facility]   [facility] - Transportation    In the past 12 months, has lack of transportation kept you from medical appointments or from getting medications?: Yes    Lack of Transportation (Non-Medical): Yes  Physical Activity: Inactive (03/08/2022)   Received from [facility], [facility]   Exercise Vital Sign    Days of Exercise per Week: 0 days    Minutes of Exercise per Session: 0 min  Stress: Stress Concern Present (03/08/2022)   Received from [facility], [facility]   [employer] [facility] - Occupational Stress Questionnaire    Feeling of Stress : Rather much  Social Connections: Moderately Isolated (03/08/2022)   Received from [facility], [facility]   Social Connection and Isolation Panel [NHANES]    Frequency of Communication with Friends and Family: More than three times a week    Frequency of Social Gatherings with Friends and Family: More than three times a week     Attends Religious Services: Never    Database administrator or Organizations: No    Attends Banker Meetings: Never    Marital Status: Living with partner  Intimate Partner Violence: Not on file    Past Surgical History:  Procedure Laterality Date   ABDOMINAL HYSTERECTOMY     BACK SURGERY     CARDIAC CATHETERIZATION  02/13/2008   [facility]   CORONARY ANGIOPLASTY     CORONARY/GRAFT ACUTE MI REVASCULARIZATION N/A 03/16/2018   Procedure: Coronary/Graft  Acute MI Revascularization;  Surgeon: Marcina Millard, MD;  Location: [facility] INVASIVE CV LAB;  Service: Cardiovascular;  Laterality: N/A;   LEFT HEART CATH AND CORONARY ANGIOGRAPHY N/A 03/16/2018   Procedure: LEFT HEART CATH AND CORONARY ANGIOGRAPHY;  Surgeon: Marcina Millard, MD;  Location: [facility] INVASIVE CV LAB;  Service: Cardiovascular;  Laterality: N/A;   THROAT SURGERY     THYROIDECTOMY      Family History  Problem Relation Age of Onset   Cancer Mother    Heart attack Father    Hypertension Daughter    Heart attack Daughter     No Known Allergies     Latest Ref Rng & Units 07/31/2019   11:41 AM 03/19/2018    5:02 AM 03/18/2018    4:03 AM  CBC  WBC 4.0 - 10.5 K/uL 14.5  12.5  11.9   Hemoglobin 12.0 - 15.0 g/dL 54.0  98.1  19.1   Hematocrit 36.0 - 46.0 % 40.0  42.7  43.7   Platelets 150 - 400 K/uL 271  211  203       CMP     Component Value Date/Time   NA 135 07/31/2019 1141   NA 138 06/20/2013 1722   K 4.0 07/31/2019 1141   K 4.7 06/20/2013 1722   CL 101 07/31/2019 1141   CL 106 06/20/2013 1722   CO2 24 07/31/2019 1141   CO2 22 06/20/2013 1722   GLUCOSE 111 (H) 07/31/2019 1141   GLUCOSE 161 (H) 06/20/2013 1722   BUN 13 07/31/2019 1141   BUN 11 06/20/2013 1722   CREATININE 0.77 07/31/2019 1141   CREATININE 0.45 (L) 06/20/2013 1722   CALCIUM 9.6 07/31/2019 1141   CALCIUM 9.3 06/20/2013 1722   PROT 8.0 07/31/2019 1141   PROT 8.3 (H) 06/20/2013 1722   ALBUMIN 3.8 07/31/2019 1141   ALBUMIN 3.7  06/20/2013 1722   AST 15 07/31/2019 1141   AST 28 06/20/2013 1722   ALT 12 07/31/2019 1141   ALT 16 06/20/2013 1722   ALKPHOS 98 07/31/2019 1141   ALKPHOS 128 (H) 06/20/2013 1722   BILITOT 1.3 (H) 07/31/2019 1141   BILITOT 0.5 06/20/2013 1722   GFRNONAA >60 07/31/2019 1141   GFRNONAA >60 06/20/2013 1722     No results found.     Assessment & Plan:   1. 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  This represents a limb threatening ischemia and places the patient at the risk for bilateral limb loss.  Patient should undergo angiography of the left then right lower extremity with the hope for intervention for limb salvage.  The risks and benefits as well as the alternative therapies was discussed in detail with the patient.  All questions were answered.  Patient agrees to proceed with  lower extremity angiography.  The patient will follow up with me in the office after the procedure.      2. Essential hypertension Continue antihypertensive medications as already ordered, these medications have been reviewed and there are no changes at this time.  3. Tobacco abuse Smoking cessation was discussed, 3-10 minutes spent on this topic specifically  4. Hyperlipidemia, unspecified hyperlipidemia type Continue statin as ordered and reviewed, no changes at this time   Current Outpatient Medications on File Prior to Visit  Medication Sig Dispense Refill   aspirin 81 MG chewable tablet Chew 1 tablet (81 mg total) by mouth daily. 180 tablet 0   atorvastatin (LIPITOR) 40 MG tablet Take 1 tablet (40 mg total) by mouth daily at 6 PM. 90 tablet 0   clopidogrel (PLAVIX) 75 MG tablet Take 1 tablet (75 mg total) by mouth daily. 90 tablet 0    gabapentin (NEURONTIN) 300 MG capsule Take 1 capsule (300 mg total) by mouth 2 (two) times daily. 60 capsule 0   lisinopril (PRINIVIL,ZESTRIL) 5 MG tablet Take 1 tablet (5 mg total) by mouth daily. 180 tablet 0   metoprolol succinate (TOPROL XL) 25 MG 24 hr tablet Take 1 tablet (25 mg total) by mouth daily. 180 tablet 0   nitroGLYCERIN (NITROSTAT) 0.4 MG SL tablet Place 1 tablet (0.4 mg total) under the tongue every 5 (five) minutes as needed for chest pain. 20 tablet 0   spironolactone (ALDACTONE) 25 MG tablet Take 1 tablet (25 mg total) by mouth daily. 30 tablet 0   oxyCODONE-acetaminophen (PERCOCET) 5-325 MG tablet Take 1 tablet by mouth every 6 (six) hours as needed for severe pain. (Patient not taking: Reported on 05/01/2023) 15 tablet 0   No current facility-administered medications on file prior to visit.    There are no Patient Instructions on file for this visit. No follow-ups on file.   Georgiana Spinner, NP

## 2023-05-06 LAB — VAS US ABI WITH/WO TBI
Left ABI: 0.59
Right ABI: 0.57

## 2023-05-08 ENCOUNTER — Telehealth (INDEPENDENT_AMBULATORY_CARE_PROVIDER_SITE_OTHER): Payer: Self-pay

## 2023-05-08 NOTE — Telephone Encounter (Signed)
 Spoke with the patient's daughter and she is scheduled with Dr. Wyn Quaker for a LLE (05/13/23- 8:30 am) and RLE (05/20/23- 6:45 am) angio at the [facility]. Pre-procedure instructions were discussed and will be mailed to the address given to my by the patient's daughter. ([address] [place], [place] [address]).

## 2023-05-13 ENCOUNTER — Ambulatory Visit
Admission: RE | Admit: 2023-05-13 | Discharge: 2023-05-13 | Disposition: A | Discharge status: Home / Self Care | Attending: Vascular Surgery | Admitting: Vascular Surgery

## 2023-05-13 ENCOUNTER — Other Ambulatory Visit: Payer: Self-pay

## 2023-05-13 ENCOUNTER — Encounter
Admission: RE | Disposition: A | Discharge status: Home / Self Care | Payer: Self-pay | Source: Home / Self Care | Attending: Vascular Surgery

## 2023-05-13 DIAGNOSIS — I70211 Atherosclerosis of native arteries of extremities with intermittent claudication, right leg: Secondary | ICD-10-CM

## 2023-05-13 DIAGNOSIS — Z79899 Other long term (current) drug therapy: Secondary | ICD-10-CM | POA: Insufficient documentation

## 2023-05-13 DIAGNOSIS — I70223 Atherosclerosis of native arteries of extremities with rest pain, bilateral legs: Secondary | ICD-10-CM | POA: Diagnosis present

## 2023-05-13 DIAGNOSIS — I110 Hypertensive heart disease with heart failure: Secondary | ICD-10-CM | POA: Diagnosis not present

## 2023-05-13 DIAGNOSIS — I70222 Atherosclerosis of native arteries of extremities with rest pain, left leg: Secondary | ICD-10-CM | POA: Diagnosis not present

## 2023-05-13 DIAGNOSIS — I70229 Atherosclerosis of native arteries of extremities with rest pain, unspecified extremity: Secondary | ICD-10-CM

## 2023-05-13 DIAGNOSIS — E785 Hyperlipidemia, unspecified: Secondary | ICD-10-CM | POA: Insufficient documentation

## 2023-05-13 DIAGNOSIS — I5022 Chronic systolic (congestive) heart failure: Secondary | ICD-10-CM | POA: Diagnosis not present

## 2023-05-13 DIAGNOSIS — F17210 Nicotine dependence, cigarettes, uncomplicated: Secondary | ICD-10-CM | POA: Diagnosis not present

## 2023-05-13 HISTORY — PX: LOWER EXTREMITY ANGIOGRAPHY: CATH118251

## 2023-05-13 LAB — CREATININE, SERUM
Creatinine, Ser: 0.76 mg/dL (ref 0.44–1.00)
GFR, Estimated: >60 mL/min (ref >60)

## 2023-05-13 LAB — BUN: BUN: 9 mg/dL (ref 8–23)

## 2023-05-13 SURGERY — LOWER EXTREMITY ANGIOGRAPHY
Anesthesia: Moderate Sedation | Site: Leg Lower | Laterality: Left

## 2023-05-13 MED ORDER — DIPHENHYDRAMINE HCL 50 MG/ML IJ SOLN: 50.0000 mg | Freq: Once | INTRAMUSCULAR | Status: DC | PRN

## 2023-05-13 MED ORDER — FAMOTIDINE 20 MG PO TABS: 40.0000 mg | ORAL_TABLET | Freq: Once | ORAL | Status: DC | PRN

## 2023-05-13 MED ORDER — MIDAZOLAM HCL 2 MG/2ML IJ SOLN
INTRAMUSCULAR | Status: AC
  Filled 2023-05-13: qty 2

## 2023-05-13 MED ORDER — FENTANYL CITRATE PF 50 MCG/ML IJ SOSY
PREFILLED_SYRINGE | INTRAMUSCULAR | Status: AC
  Filled 2023-05-13: qty 1

## 2023-05-13 MED ORDER — SODIUM CHLORIDE 0.9% FLUSH: 3.0000 mL | Freq: Two times a day (BID) | INTRAVENOUS | Status: DC

## 2023-05-13 MED ORDER — MIDAZOLAM HCL 2 MG/2ML IJ SOLN
INTRAMUSCULAR | Status: DC | PRN
  Administered 2023-05-13: 2 mg via INTRAVENOUS

## 2023-05-13 MED ORDER — CEFAZOLIN SODIUM-DEXTROSE 2-4 GM/100ML-% IV SOLN
INTRAVENOUS | Status: AC
  Filled 2023-05-13: qty 100

## 2023-05-13 MED ORDER — SODIUM CHLORIDE 0.9 % IV SOLN: INTRAVENOUS | Status: DC

## 2023-05-13 MED ORDER — HEPARIN (PORCINE) IN NACL 1000-0.9 UT/500ML-% IV SOLN
INTRAVENOUS | Status: DC | PRN
  Administered 2023-05-13: 1000 mL

## 2023-05-13 MED ORDER — LABETALOL HCL 5 MG/ML IV SOLN: 10.0000 mg | INTRAVENOUS | Status: DC | PRN

## 2023-05-13 MED ORDER — HYDRALAZINE HCL 20 MG/ML IJ SOLN: 5.0000 mg | INTRAMUSCULAR | Status: DC | PRN

## 2023-05-13 MED ORDER — SODIUM CHLORIDE 0.9% FLUSH: 3.0000 mL | INTRAVENOUS | Status: DC | PRN

## 2023-05-13 MED ORDER — LIDOCAINE-EPINEPHRINE (PF) 1 %-1:200000 IJ SOLN
INTRAMUSCULAR | Status: DC | PRN
Start: 2023-05-13 — End: 2023-05-13
  Administered 2023-05-13: 10 mL via INTRADERMAL

## 2023-05-13 MED ORDER — HEPARIN SODIUM (PORCINE) 1000 UNIT/ML IJ SOLN
INTRAMUSCULAR | Status: AC
  Filled 2023-05-13: qty 10

## 2023-05-13 MED ORDER — METHYLPREDNISOLONE SODIUM SUCC 125 MG IJ SOLR: 125.0000 mg | Freq: Once | INTRAMUSCULAR | Status: DC | PRN

## 2023-05-13 MED ORDER — SODIUM CHLORIDE 0.9 % IV SOLN: 250.0000 mL | INTRAVENOUS | Status: DC | PRN

## 2023-05-13 MED ORDER — HEPARIN SODIUM (PORCINE) 1000 UNIT/ML IJ SOLN
INTRAMUSCULAR | Status: DC | PRN
  Administered 2023-05-13: 4000 [IU] via INTRAVENOUS

## 2023-05-13 MED ORDER — ACETAMINOPHEN 325 MG PO TABS: 650.0000 mg | ORAL_TABLET | ORAL | Status: DC | PRN

## 2023-05-13 MED ORDER — HYDROMORPHONE HCL 1 MG/ML IJ SOLN: 1.0000 mg | Freq: Once | INTRAMUSCULAR | Status: DC | PRN

## 2023-05-13 MED ORDER — CEFAZOLIN SODIUM-DEXTROSE 2-4 GM/100ML-% IV SOLN: 2.0000 g | INTRAVENOUS | Status: DC

## 2023-05-13 MED ORDER — ONDANSETRON HCL 4 MG/2ML IJ SOLN: 4.0000 mg | Freq: Four times a day (QID) | INTRAMUSCULAR | Status: DC | PRN

## 2023-05-13 MED ORDER — CEFAZOLIN SODIUM-DEXTROSE 1-4 GM/50ML-% IV SOLN
INTRAVENOUS | Status: AC | PRN
  Administered 2023-05-13: 2 g via INTRAVENOUS

## 2023-05-13 MED ORDER — MIDAZOLAM HCL 2 MG/ML PO SYRP: 8.0000 mg | ORAL_SOLUTION | Freq: Once | ORAL | Status: DC | PRN

## 2023-05-13 MED ORDER — IODIXANOL 320 MG/ML IV SOLN
INTRAVENOUS | Status: DC | PRN
  Administered 2023-05-13: 65 mL via INTRA_ARTERIAL

## 2023-05-13 MED ORDER — FENTANYL CITRATE (PF) 100 MCG/2ML IJ SOLN
INTRAMUSCULAR | Status: DC | PRN
  Administered 2023-05-13: 25 ug via INTRAVENOUS
  Administered 2023-05-13: 50 ug via INTRAVENOUS
  Administered 2023-05-13: 25 ug via INTRAVENOUS

## 2023-05-13 SURGICAL SUPPLY — 14 items
BALLN LUTONIX AV 10X40X75 (BALLOONS) ×1 IMPLANT
BALLOON LUTONIX AV 10X40X75 (BALLOONS) IMPLANT
CATH ANGIO 5F PIGTAIL 65CM (CATHETERS) IMPLANT
COVER PROBE ULTRASOUND 5X96 (MISCELLANEOUS) IMPLANT
DEVICE PRESTO INFLATION (MISCELLANEOUS) IMPLANT
DEVICE STARCLOSE SE CLOSURE (Vascular Products) IMPLANT
GLIDEWIRE ADV .035X260CM (WIRE) IMPLANT
INTRODUCER 7FR 23CM (INTRODUCER) IMPLANT
PACK ANGIOGRAPHY (CUSTOM PROCEDURE TRAY) ×1 IMPLANT
SHEATH AVANTI 5FR X 11CM (SHEATH) IMPLANT
STENT LIFESTREAM 9X38X80 (Permanent Stent) IMPLANT
SYR MEDRAD MARK 7 150ML (SYRINGE) IMPLANT
TUBING CONTRAST HIGH PRESS 72 (TUBING) IMPLANT
WIRE J 3MM .035X145CM (WIRE) IMPLANT

## 2023-05-13 NOTE — Op Note (Signed)
 [facility]  Percutaneous Study/Intervention Procedural Note   Date of Surgery: 05/13/2023  Surgeon(s):Lainie Daubert    Assistants:none  Pre-operative Diagnosis: PAD with claudication bilateral lower extremities and rest pain of the left lower extremity  Post-operative diagnosis:  Same  Procedure(s) Performed:             1.  Ultrasound guidance for vascular access right femoral artery             2.  Catheter placement into aorta from right femoral approach             3.  Aortogram and selective left lower extremity angiogram             4.  Lifestream stent placement to the right common iliac artery with 9 mm diameter by 38 mm length Lifestream stent postdilated to 10 mm             5.  StarClose closure device right femoral artery  EBL: 10 cc  Contrast: 65 cc  Fluoro Time: 4.7 minutes  Moderate Conscious Sedation Time: approximately 34 minutes using 2 mg of Versed and 100 mcg of Fentanyl              Indications:  Patient is a 63 y.o.female with disabling claudication symptoms of both lower extremities and now with rest pain symptoms on the left. The patient has noninvasive study showing markedly reduced ABIs bilaterally. The patient is brought in for angiography for further evaluation and potential treatment.  Due to the limb threatening nature of the situation, angiogram was performed for attempted limb salvage. The patient is aware that if the procedure fails, amputation would be expected.  The patient also understands that even with successful revascularization, amputation may still be required due to the severity of the situation.  Risks and benefits are discussed and informed consent is obtained.   Procedure:  The patient was identified and appropriate procedural time out was performed.  The patient was then placed supine on the table and prepped and draped in the usual sterile fashion. Moderate conscious sedation was administered during a face to face  encounter with the patient throughout the procedure with my supervision of the RN administering medicines and monitoring the patient's vital signs, pulse oximetry, telemetry and mental status throughout from the start of the procedure until the patient was taken to the recovery room. Ultrasound was used to evaluate the right common femoral artery.  It was patent but somewhat calcific.  A digital ultrasound image was acquired.  A Seldinger needle was used to access the right common femoral artery under direct ultrasound guidance and a permanent image was performed.  A 0.035 J wire was advanced without resistance and a 5Fr sheath was placed.  Pigtail catheter was placed into the aorta and an AP aortogram was performed.  I then pulled the pigtail catheter down and perform pelvic obliques.  This demonstrated normal renal arteries and normal aorta.  The left common and external iliac arteries did not appear to have any significant stenosis.  The right common iliac artery had a greater than 80% stenosis about 1 to 1-1/2 cm beyond its origin and then had a little poststenotic dilatation before the iliac bifurcation.  The right external iliac artery did not have any significant stenosis.  The right common femoral artery did not any significant stenosis but there was clearly a high-grade lesion in the left common femoral artery.  I then imaged the left lower extremity through  the pigtail catheter in the aorta in an LAO projection. Selective left lower extremity angiogram was then performed. This demonstrated very calcific and near occlusive stenosis of the several centimeters of the left common femoral artery creating greater than 95% stenosis.  This spilled into the origins of the profunda and superficial femoral artery but after the proximal lesion the remainder of the superficial femoral and popliteal arteries appeared patent with less than 30% stenosis identified.  There were normal tibial vessels with the anterior  tibial artery being the dominant runoff to the foot.  Flow was slow due to injection from the aorta and the high-grade common femoral lesion, but there did not appear to be any infrainguinal disease requiring treatment.  The left leg would need to be treated with a left femoral endarterectomy.  It was felt that it was in the patient's best interest to proceed with intervention of the right iliac lesion today after these images to avoid a second procedure and a larger amount of contrast and fluoroscopy based off of the findings from the initial angiogram. The patient was systemically heparinized and a 23 cm 7 French sheath was placed in the right femoral artery up into the aorta. I then selected a 9 mm diameter by 38 mm length Lifestream stent in the right common iliac artery taking care not to get this into the aorta and impair flow on the left side.  This was started at the origin of the right common iliac artery and terminated above the right internal iliac artery.  It was inflated to 12 atm and then I postdilated the stent with a 10 mm diameter by 4 cm length balloon with excellent angiographic completion result and only about 10% residual stenosis in the right common iliac artery.  The sheath was removed and StarClose closure device was deployed in the right femoral artery with excellent hemostatic result. The patient was taken to the recovery room in stable condition having tolerated the procedure well.  Findings:               Aortogram:  This demonstrated normal renal arteries and normal aorta.  The left common and external iliac arteries did not appear to have any significant stenosis.  The right common iliac artery had a greater than 80% stenosis about 1 to 1-1/2 cm beyond its origin and then had a little poststenotic dilatation before the iliac bifurcation.  The right external iliac artery did not have any significant stenosis.  The right common femoral artery did not any significant stenosis but there  was clearly a high-grade lesion in the left common femoral artery.             Left lower Extremity:  This demonstrated very calcific and near occlusive stenosis of the several centimeters of the left common femoral artery creating greater than 95% stenosis.  This spilled into the origins of the profunda and superficial femoral artery but after the proximal lesion the remainder of the superficial femoral and popliteal arteries appeared patent with less than 30% stenosis identified.  There were normal tibial vessels with the anterior tibial artery being the dominant runoff to the foot.  Flow was slow due to injection from the aorta and the high-grade common femoral lesion, but there did not appear to be any infrainguinal disease requiring treatment.  The left leg would need to be treated with a left femoral endarterectomy.   Disposition: Patient was taken to the recovery room in stable condition having tolerated the procedure  well.  Complications: None  [name] 05/13/2023 11:09 AM   This note was created with Dragon Medical transcription system. Any errors in dictation are purely unintentional.

## 2023-05-13 NOTE — Interval H&P Note (Signed)
 History and Physical Interval Note:  05/13/2023 10:08 AM  Brianna Roth  has presented today for surgery, with the diagnosis of LLE Angio   ASO w rest pain.  The various methods of treatment have been discussed with the patient and family. After consideration of risks, benefits and other options for treatment, the patient has consented to  Procedure(s): Lower Extremity Angiography (Left) as a surgical intervention.  The patient's history has been reviewed, patient examined, no change in status, stable for surgery.  I have reviewed the patient's chart and labs.  Questions were answered to the patient's satisfaction.     [name]

## 2023-05-14 ENCOUNTER — Encounter: Payer: Self-pay | Admitting: Vascular Surgery

## 2023-05-20 ENCOUNTER — Ambulatory Visit: Admission: RE | Admit: 2023-05-20 | Source: Home / Self Care | Admitting: Vascular Surgery

## 2023-05-20 ENCOUNTER — Encounter: Admission: RE | Payer: Self-pay | Source: Home / Self Care

## 2023-05-20 DIAGNOSIS — I70229 Atherosclerosis of native arteries of extremities with rest pain, unspecified extremity: Secondary | ICD-10-CM

## 2023-05-20 SURGERY — LOWER EXTREMITY ANGIOGRAPHY
Anesthesia: Moderate Sedation | Site: Leg Lower | Laterality: Right

## 2023-05-27 ENCOUNTER — Other Ambulatory Visit (INDEPENDENT_AMBULATORY_CARE_PROVIDER_SITE_OTHER): Payer: Self-pay | Admitting: Vascular Surgery

## 2023-05-27 DIAGNOSIS — Z9889 Other specified postprocedural states: Secondary | ICD-10-CM

## 2023-05-28 ENCOUNTER — Ambulatory Visit (INDEPENDENT_AMBULATORY_CARE_PROVIDER_SITE_OTHER)

## 2023-05-28 ENCOUNTER — Ambulatory Visit (INDEPENDENT_AMBULATORY_CARE_PROVIDER_SITE_OTHER): Admitting: Vascular Surgery

## 2023-05-28 ENCOUNTER — Encounter (INDEPENDENT_AMBULATORY_CARE_PROVIDER_SITE_OTHER): Payer: Self-pay | Admitting: Vascular Surgery

## 2023-05-28 VITALS — BP 113/72 | HR 61 | Resp 18 | Ht 63.0 in | Wt 127.1 lb

## 2023-05-28 DIAGNOSIS — Z720 Tobacco use: Secondary | ICD-10-CM | POA: Diagnosis not present

## 2023-05-28 DIAGNOSIS — I739 Peripheral vascular disease, unspecified: Secondary | ICD-10-CM | POA: Diagnosis not present

## 2023-05-28 DIAGNOSIS — Z9889 Other specified postprocedural states: Secondary | ICD-10-CM

## 2023-05-28 DIAGNOSIS — I10 Essential (primary) hypertension: Secondary | ICD-10-CM

## 2023-05-28 DIAGNOSIS — I70213 Atherosclerosis of native arteries of extremities with intermittent claudication, bilateral legs: Secondary | ICD-10-CM | POA: Diagnosis not present

## 2023-05-28 DIAGNOSIS — E785 Hyperlipidemia, unspecified: Secondary | ICD-10-CM

## 2023-05-28 NOTE — H&P (View-Only) (Signed)
 MRN : [id number]  Brianna Roth is a 63 y.o. (1960-06-07) female who presents with chief complaint of  Chief Complaint  Patient presents with   Follow-up    fu 2 weeks w/ABI see FB/JD- post op  .  History of Present Illness: Patient returns today in follow up of her PAD.  She had an angiogram performed a couple of weeks ago where she was found to have severe left common femoral and bifurcation disease.  She also had right iliac stenosis that was treated with stenting.  Her right leg is doing better in terms of the claudication.  Her ABI today on the right leg was greater than 1 with triphasic waveforms.  Her left ABI remained reduced at 0.7 which is not surprising as no intervention was performed yet on the left leg.  We are planning a left femoral endarterectomy in the coming weeks.  She continues to have severe claudication symptoms on the left.  Current Outpatient Medications  Medication Sig Dispense Refill   aspirin  81 MG chewable tablet Chew 1 tablet (81 mg total) by mouth daily. 180 tablet 0   atorvastatin  (LIPITOR) 40 MG tablet Take 1 tablet (40 mg total) by mouth daily at 6 PM. 90 tablet 0   clopidogrel  (PLAVIX ) 75 MG tablet Take 1 tablet (75 mg total) by mouth daily. 90 tablet 0   gabapentin  (NEURONTIN ) 300 MG capsule Take 1 capsule (300 mg total) by mouth 2 (two) times daily. 60 capsule 0   lisinopril  (PRINIVIL ,ZESTRIL ) 5 MG tablet Take 1 tablet (5 mg total) by mouth daily. 180 tablet 0   metoprolol  succinate (TOPROL  XL) 25 MG 24 hr tablet Take 1 tablet (25 mg total) by mouth daily. 180 tablet 0   nitroGLYCERIN  (NITROSTAT ) 0.4 MG SL tablet Place 1 tablet (0.4 mg total) under the tongue every 5 (five) minutes as needed for chest pain. 20 tablet 0   spironolactone  (ALDACTONE ) 25 MG tablet Take 1 tablet (25 mg total) by mouth daily. 30 tablet 0   oxyCODONE -acetaminophen  (PERCOCET) 5-325 MG tablet Take 1 tablet by mouth every 6 (six) hours as needed for severe pain. (Patient not  taking: Reported on 05/01/2023) 15 tablet 0   No current facility-administered medications for this visit.    Past Medical History:  Diagnosis Date   Asthma    Cancer (HCC)    Chronic systolic heart failure (HCC)    Due to postinfarct cardiomyopathy. Ejection fraction was 30% in 2010 with anterior wall akinesis   Coronary artery disease 05/2008   Acute anterior ST elevation myocardial infarction . Delayed intervention at [facility] due to mental status changes and suspected stroke. MRI showed old strokes. Cardiac cath showed an occluded mid LAD with 50% mid RCA stenosis and mild left circumflex disease. She had thrombectomy and bare-metal stent placement to the mid LAD (2.75 x 24 mm). Ejection fraction was 25%   Hyperlipidemia    Hypertension    MI (myocardial infarction) (HCC)    Stroke (HCC)    Noted on MRI in 2010   Tobacco abuse     Past Surgical History:  Procedure Laterality Date   ABDOMINAL HYSTERECTOMY     BACK SURGERY     CARDIAC CATHETERIZATION  02/13/2008   [facility]   CORONARY ANGIOPLASTY     CORONARY/GRAFT ACUTE MI REVASCULARIZATION N/A 03/16/2018   Procedure: Coronary/Graft Acute MI Revascularization;  Surgeon: Percival Brace, MD;  Location: [facility] INVASIVE CV LAB;  Service: Cardiovascular;  Laterality: N/A;   LEFT  HEART CATH AND CORONARY ANGIOGRAPHY N/A 03/16/2018   Procedure: LEFT HEART CATH AND CORONARY ANGIOGRAPHY;  Surgeon: Percival Brace, MD;  Location: [facility] INVASIVE CV LAB;  Service: Cardiovascular;  Laterality: N/A;   LOWER EXTREMITY ANGIOGRAPHY Left 05/13/2023   Procedure: Lower Extremity Angiography;  Surgeon: Celso College, MD;  Location: [facility] INVASIVE CV LAB;  Service: Cardiovascular;  Laterality: Left;   THROAT SURGERY     THYROIDECTOMY       Social History   Tobacco Use   Smoking status: Every Day    Current packs/day: 0.25    Average packs/day: 0.3 packs/day for 25.0 years (6.3 ttl pk-yrs)    Types: Cigarettes   Smokeless tobacco: Never   Substance Use Topics   Alcohol use: No   Drug use: No      Family History  Problem Relation Age of Onset   Cancer Mother    Heart attack Father    Hypertension Daughter    Heart attack Daughter      No Known Allergies   REVIEW OF SYSTEMS (Negative unless checked)  Constitutional: [] Weight loss  [] Fever  [] Chills Cardiac: [] Chest pain   [] Chest pressure   [] Palpitations   [] Shortness of breath when laying flat   [] Shortness of breath at rest   [] Shortness of breath with exertion. Vascular:  [x] Pain in legs with walking   [] Pain in legs at rest   [] Pain in legs when laying flat   [x] Claudication   [] Pain in feet when walking  [] Pain in feet at rest  [] Pain in feet when laying flat   [] History of DVT   [] Phlebitis   [] Swelling in legs   [] Varicose veins   [] Non-healing ulcers Pulmonary:   [] Uses home oxygen   [] Productive cough   [] Hemoptysis   [] Wheeze  [] COPD   [x] Asthma Neurologic:  [] Dizziness  [] Blackouts   [] Seizures   [x] History of stroke   [] History of TIA  [] Aphasia   [] Temporary blindness   [] Dysphagia   [] Weakness or numbness in arms   [] Weakness or numbness in legs Musculoskeletal:  [] Arthritis   [] Joint swelling   [] Joint pain   [] Low back pain Hematologic:  [] Easy bruising  [] Easy bleeding   [] Hypercoagulable state   [] Anemic   Gastrointestinal:  [] Blood in stool   [] Vomiting blood  [] Gastroesophageal reflux/heartburn   [] Abdominal pain Genitourinary:  [] Chronic kidney disease   [] Difficult urination  [] Frequent urination  [] Burning with urination   [] Hematuria Skin:  [] Rashes   [] Ulcers   [] Wounds Psychological:  [] History of anxiety   []  History of major depression.  Physical Examination  BP 113/72   Pulse 61   Resp 18   Ht 5\' 3"  (1.6 m)   Wt 127 lb 1.6 oz (57.7 kg)   BMI 22.51 kg/m  Gen:  WD/WN, NAD Head: [place]/AT, No temporalis wasting. Ear/Nose/Throat: Hearing grossly intact, nares w/o erythema or drainage Eyes: Conjunctiva clear. Sclera non-icteric Neck:  Supple.  Trachea midline Pulmonary:  Good air movement, no use of accessory muscles.  Cardiac: RRR, no JVD Vascular:  Vessel Right Left  Radial Palpable Palpable                          PT 2+ Palpable Trace Palpable  DP 2+ Palpable 1+ Palpable   Gastrointestinal: soft, non-tender/non-distended. No guarding/reflex.  Musculoskeletal: M/S 5/5 throughout.  No deformity or atrophy. No edema. Neurologic: Sensation grossly intact in extremities.  Symmetrical.  Speech is fluent.  Psychiatric: Judgment  intact, Mood & affect appropriate for pt's clinical situation. Dermatologic: No rashes or ulcers noted.  No cellulitis or open wounds.      Labs Recent Results (from the past 2160 hours)  VAS US  ABI WITH/WO TBI     Status: None   Collection Time: 05/01/23 11:34 AM  Result Value Ref Range   Right ABI 0.57    Left ABI 0.59   BUN     Status: None   Collection Time: 05/13/23  9:31 AM  Result Value Ref Range   BUN 9 8 - 23 mg/dL    Comment: Performed at [facility], [address] [place], [place] [address]  Creatinine, serum     Status: None   Collection Time: 05/13/23  9:31 AM  Result Value Ref Range   Creatinine, Ser 0.76 0.44 - 1.00 mg/dL   GFR, Estimated >60 >45 mL/min    Comment: (NOTE) Calculated using the CKD-EPI Creatinine Equation (2021) Performed at [facility], [address] [place], [place] [address]     Radiology PERIPHERAL VASCULAR CATHETERIZATION Result Date: 05/13/2023 See surgical note for result.  VAS US  ABI WITH/WO TBI Result Date: 05/06/2023  LOWER EXTREMITY DOPPLER STUDY Patient Name:  Brianna Roth [facility]  Date of Exam:   05/01/2023 Medical Rec #: [id number]           Accession #:    [id number] Date of Birth: 28-Jul-1960           Patient Gender: F Patient Age:   28 years Exam Location:  [facility] Procedure:      VAS US  ABI WITH/WO TBI Referring Phys: Sharla Davis  --------------------------------------------------------------------------------  Indications: Claudication, rest pain, and peripheral artery disease. High Risk Factors: Hypertension, current smoker, coronary artery disease.  Performing Technologist: Oneta Bilberry RVT  Examination Guidelines: A complete evaluation includes at minimum, Doppler waveform signals and systolic blood pressure reading at the level of bilateral brachial, anterior tibial, and posterior tibial arteries, when vessel segments are accessible. Bilateral testing is considered an integral part of a complete examination. Photoelectric Plethysmograph (PPG) waveforms and toe systolic pressure readings are included as required and additional duplex testing as needed. Limited examinations for reoccurring indications may be performed as noted.  ABI Findings: +---------+------------------+-----+--------+--------+ Right    Rt Pressure (mmHg)IndexWaveformComment  +---------+------------------+-----+--------+--------+ Brachial 134                                     +---------+------------------+-----+--------+--------+ PTA      69                0.51 biphasic         +---------+------------------+-----+--------+--------+ DP       77                0.57 biphasic         +---------+------------------+-----+--------+--------+ Great Toe45                0.34                  +---------+------------------+-----+--------+--------+ +---------+------------------+-----+--------+-------+ Left     Lt Pressure (mmHg)IndexWaveformComment +---------+------------------+-----+--------+-------+ Brachial 129                                    +---------+------------------+-----+--------+-------+ PTA      79  0.59 biphasic        +---------+------------------+-----+--------+-------+ DP       66                0.49 biphasic        +---------+------------------+-----+--------+-------+ Great Toe34                 0.25                 +---------+------------------+-----+--------+-------+ +-------+-----------+-----------+------------+------------+ ABI/TBIToday's ABIToday's TBIPrevious ABIPrevious TBI +-------+-----------+-----------+------------+------------+ Right  0.57       0.34                                +-------+-----------+-----------+------------+------------+ Left   0.59       0.25                                +-------+-----------+-----------+------------+------------+  Summary: Right: Resting right ankle-brachial index indicates moderate right lower extremity arterial disease. The right toe-brachial index is abnormal. Left: Resting left ankle-brachial index indicates moderate left lower extremity arterial disease. The left toe-brachial index is abnormal. *See table(s) above for measurements and observations.  Electronically signed by Devon Fogo MD on 05/06/2023 at 7:38:16 AM.    Final     Assessment/Plan  Essential hypertension blood pressure control important in reducing the progression of atherosclerotic disease. On appropriate oral medications.   Hyperlipidemia lipid control important in reducing the progression of atherosclerotic disease. Continue statin therapy   Tobacco abuse Represents a significant atherosclerotic risk factor and cessation would be of benefit long-term for patency of any surgery or intervention.  Atherosclerosis of native arteries of extremity with intermittent claudication [facility]) The patient is had good results after right iliac stenting for right iliac disease.  Her right-sided claudication is markedly improved.  She has severe stenosis of the left common femoral artery and femoral bifurcation and will require a left femoral endarterectomy.  The procedure was discussed in detail with she and her daughter today.  We discussed the expected hospitalization and length of stay.  We discussed possible complications and alternatives.  She desires  to proceed with surgery and this will be scheduled in the near future at her convenience.    Mikki Alexander, MD  05/29/2023 8:29 AM    This note was created with Dragon medical transcription system.  Any errors from dictation are purely unintentional

## 2023-05-28 NOTE — Progress Notes (Unsigned)
 MRN : [id number]  Brianna Roth is a 63 y.o. (04/16/1960) female who presents with chief complaint of No chief complaint on file. Marland Kitchen  History of Present Illness: Patient returns today in follow up of ***  Current Outpatient Medications  Medication Sig Dispense Refill   aspirin 81 MG chewable tablet Chew 1 tablet (81 mg total) by mouth daily. 180 tablet 0   atorvastatin (LIPITOR) 40 MG tablet Take 1 tablet (40 mg total) by mouth daily at 6 PM. 90 tablet 0   clopidogrel (PLAVIX) 75 MG tablet Take 1 tablet (75 mg total) by mouth daily. 90 tablet 0   gabapentin (NEURONTIN) 300 MG capsule Take 1 capsule (300 mg total) by mouth 2 (two) times daily. 60 capsule 0   lisinopril (PRINIVIL,ZESTRIL) 5 MG tablet Take 1 tablet (5 mg total) by mouth daily. 180 tablet 0   metoprolol succinate (TOPROL XL) 25 MG 24 hr tablet Take 1 tablet (25 mg total) by mouth daily. 180 tablet 0   nitroGLYCERIN (NITROSTAT) 0.4 MG SL tablet Place 1 tablet (0.4 mg total) under the tongue every 5 (five) minutes as needed for chest pain. 20 tablet 0   oxyCODONE-acetaminophen (PERCOCET) 5-325 MG tablet Take 1 tablet by mouth every 6 (six) hours as needed for severe pain. (Patient not taking: Reported on 05/01/2023) 15 tablet 0   spironolactone (ALDACTONE) 25 MG tablet Take 1 tablet (25 mg total) by mouth daily. 30 tablet 0   No current facility-administered medications for this visit.    Past Medical History:  Diagnosis Date   Asthma    Cancer (HCC)    Chronic systolic heart failure (HCC)    Due to postinfarct cardiomyopathy. Ejection fraction was 30% in 2010 with anterior wall akinesis   Coronary artery disease 05/2008   Acute anterior ST elevation myocardial infarction . Delayed intervention at [facility] due to mental status changes and suspected stroke. MRI showed old strokes. Cardiac cath showed an occluded mid LAD with 50% mid RCA stenosis and mild left circumflex disease. She had thrombectomy and bare-metal stent  placement to the mid LAD (2.75 x 24 mm). Ejection fraction was 25%   Hyperlipidemia    Hypertension    MI (myocardial infarction) (HCC)    Stroke (HCC)    Noted on MRI in 2010   Tobacco abuse     Past Surgical History:  Procedure Laterality Date   ABDOMINAL HYSTERECTOMY     BACK SURGERY     CARDIAC CATHETERIZATION  02/13/2008   [facility]   CORONARY ANGIOPLASTY     CORONARY/GRAFT ACUTE MI REVASCULARIZATION N/A 03/16/2018   Procedure: Coronary/Graft Acute MI Revascularization;  Surgeon: Marcina Millard, MD;  Location: [facility] INVASIVE CV LAB;  Service: Cardiovascular;  Laterality: N/A;   LEFT HEART CATH AND CORONARY ANGIOGRAPHY N/A 03/16/2018   Procedure: LEFT HEART CATH AND CORONARY ANGIOGRAPHY;  Surgeon: Marcina Millard, MD;  Location: [facility] INVASIVE CV LAB;  Service: Cardiovascular;  Laterality: N/A;   LOWER EXTREMITY ANGIOGRAPHY Left 05/13/2023   Procedure: Lower Extremity Angiography;  Surgeon: Annice Needy, MD;  Location: [facility] INVASIVE CV LAB;  Service: Cardiovascular;  Laterality: Left;   THROAT SURGERY     THYROIDECTOMY       Social History   Tobacco Use   Smoking status: Every Day    Current packs/day: 0.25    Average packs/day: 0.3 packs/day for 25.0 years (6.3 ttl pk-yrs)    Types: Cigarettes   Smokeless tobacco: Never  Substance Use Topics  Alcohol use: No   Drug use: No   ***    Family History  Problem Relation Age of Onset   Cancer Mother    Heart attack Father    Hypertension Daughter    Heart attack Daughter    ***  No Known Allergies   REVIEW OF SYSTEMS (Negative unless checked)  Constitutional: [] Weight loss  [] Fever  [] Chills Cardiac: [] Chest pain   [] Chest pressure   [] Palpitations   [] Shortness of breath when laying flat   [] Shortness of breath at rest   [] Shortness of breath with exertion. Vascular:  [] Pain in legs with walking   [] Pain in legs at rest   [] Pain in legs when laying flat   [] Claudication   [] Pain in feet when walking   [] Pain in feet at rest  [] Pain in feet when laying flat   [] History of DVT   [] Phlebitis   [] Swelling in legs   [] Varicose veins   [] Non-healing ulcers Pulmonary:   [] Uses home oxygen   [] Productive cough   [] Hemoptysis   [] Wheeze  [] COPD   [] Asthma Neurologic:  [] Dizziness  [] Blackouts   [] Seizures   [] History of stroke   [] History of TIA  [] Aphasia   [] Temporary blindness   [] Dysphagia   [] Weakness or numbness in arms   [] Weakness or numbness in legs Musculoskeletal:  [] Arthritis   [] Joint swelling   [] Joint pain   [] Low back pain Hematologic:  [] Easy bruising  [] Easy bleeding   [] Hypercoagulable state   [] Anemic   Gastrointestinal:  [] Blood in stool   [] Vomiting blood  [] Gastroesophageal reflux/heartburn   [] Abdominal pain Genitourinary:  [] Chronic kidney disease   [] Difficult urination  [] Frequent urination  [] Burning with urination   [] Hematuria Skin:  [] Rashes   [] Ulcers   [] Wounds Psychological:  [] History of anxiety   []  History of major depression.  Physical Examination  There were no vitals taken for this visit. Gen:  WD/WN, NAD Head: [place]/AT, No temporalis wasting. Ear/Nose/Throat: Hearing grossly intact, nares w/o erythema or drainage Eyes: Conjunctiva clear. Sclera non-icteric Neck: Supple.  Trachea midline Pulmonary:  Good air movement, no use of accessory muscles.  Cardiac: RRR, no JVD Vascular: *** Vessel Right Left  Radial Palpable Palpable                          PT Palpable Palpable  DP Palpable Palpable   Gastrointestinal: soft, non-tender/non-distended. No guarding/reflex.  Musculoskeletal: M/S 5/5 throughout.  No deformity or atrophy. *** edema. Neurologic: Sensation grossly intact in extremities.  Symmetrical.  Speech is fluent.  Psychiatric: Judgment intact, Mood & affect appropriate for pt's clinical situation. Dermatologic: No rashes or ulcers noted.  No cellulitis or open wounds.      Labs Recent Results (from the past 2160 hours)  VAS [place] ABI  WITH/WO TBI     Status: None   Collection Time: 05/01/23 11:34 AM  Result Value Ref Range   Right ABI 0.57    Left ABI 0.59   BUN     Status: None   Collection Time: 05/13/23  9:31 AM  Result Value Ref Range   BUN 9 8 - 23 mg/dL    Comment: Performed at [facility], [address] [place], [place] [address]  Creatinine, serum     Status: None   Collection Time: 05/13/23  9:31 AM  Result Value Ref Range   Creatinine, Ser 0.76 0.44 - 1.00 mg/dL   GFR, Estimated >60 >45 mL/min  Comment: (NOTE) Calculated using the CKD-EPI Creatinine Equation (2021) Performed at [facility], [address] [place], [place] [address]     Radiology PERIPHERAL VASCULAR CATHETERIZATION Result Date: 05/13/2023 See surgical note for result.  VAS [place] ABI WITH/WO TBI Result Date: 05/06/2023  LOWER EXTREMITY DOPPLER STUDY Patient Name:  Zetha Kuhar [facility]  Date of Exam:   05/01/2023 Medical Rec #: [id number]           Accession #:    [id number] Date of Birth: [birth year]           Patient Gender: F Patient Age:   61 years Exam Location:  [facility] Procedure:      VAS [place] ABI WITH/WO TBI Referring Phys: Sheppard Plumber --------------------------------------------------------------------------------  Indications: Claudication, rest pain, and peripheral artery disease. High Risk Factors: Hypertension, current smoker, coronary artery disease.  Performing Technologist: Hardie Lora RVT  Examination Guidelines: A complete evaluation includes at minimum, Doppler waveform signals and systolic blood pressure reading at the level of bilateral brachial, anterior tibial, and posterior tibial arteries, when vessel segments are accessible. Bilateral testing is considered an integral part of a complete examination. Photoelectric Plethysmograph (PPG) waveforms and toe systolic pressure readings are included as required and additional duplex testing as needed. Limited examinations for reoccurring  indications may be performed as noted.  ABI Findings: +---------+------------------+-----+--------+--------+ Right    Rt Pressure (mmHg)IndexWaveformComment  +---------+------------------+-----+--------+--------+ Brachial 134                                     +---------+------------------+-----+--------+--------+ PTA      69                0.51 biphasic         +---------+------------------+-----+--------+--------+ DP       77                0.57 biphasic         +---------+------------------+-----+--------+--------+ Great Toe45                0.34                  +---------+------------------+-----+--------+--------+ +---------+------------------+-----+--------+-------+ Left     Lt Pressure (mmHg)IndexWaveformComment +---------+------------------+-----+--------+-------+ Brachial 129                                    +---------+------------------+-----+--------+-------+ PTA      79                0.59 biphasic        +---------+------------------+-----+--------+-------+ DP       66                0.49 biphasic        +---------+------------------+-----+--------+-------+ Great Toe34                0.25                 +---------+------------------+-----+--------+-------+ +-------+-----------+-----------+------------+------------+ ABI/TBIToday's ABIToday's TBIPrevious ABIPrevious TBI +-------+-----------+-----------+------------+------------+ Right  0.57       0.34                                +-------+-----------+-----------+------------+------------+ Left   0.59       0.25                                +-------+-----------+-----------+------------+------------+  Summary: Right: Resting right ankle-brachial index indicates moderate right lower extremity arterial disease. The right toe-brachial index is abnormal. Left: Resting left ankle-brachial index indicates moderate left lower extremity arterial disease. The left toe-brachial  index is abnormal. *See table(s) above for measurements and observations.  Electronically signed by Devon Fogo MD on 05/06/2023 at 7:38:16 AM.    Final     Assessment/Plan  No problem-specific Assessment & Plan notes found for this encounter.    Mikki Alexander, MD  05/28/2023 1:49 PM    This note was created with Dragon medical transcription system.  Any errors from dictation are purely unintentional

## 2023-05-29 DIAGNOSIS — I70219 Atherosclerosis of native arteries of extremities with intermittent claudication, unspecified extremity: Secondary | ICD-10-CM | POA: Insufficient documentation

## 2023-05-29 LAB — VAS US ABI WITH/WO TBI
Left ABI: 0.81
Right ABI: 1.08

## 2023-05-29 NOTE — Assessment & Plan Note (Signed)
 The patient is had good results after right iliac stenting for right iliac disease.  Her right-sided claudication is markedly improved.  She has severe stenosis of the left common femoral artery and femoral bifurcation and will require a left femoral endarterectomy.  The procedure was discussed in detail with she and her daughter today.  We discussed the expected hospitalization and length of stay.  We discussed possible complications and alternatives.  She desires to proceed with surgery and this will be scheduled in the near future at her convenience.

## 2023-05-29 NOTE — Assessment & Plan Note (Signed)
 lipid control important in reducing the progression of atherosclerotic disease. Continue statin therapy

## 2023-05-29 NOTE — Patient Instructions (Signed)
 Poor Blood Flow (Peripheral Vascular Disease): What to Know  Peripheral vascular disease (PVD) is a disease of the blood vessels. It usually affects arteries, which are the blood vessels that carry blood from the heart to the rest of the body. PVD is also called peripheral artery disease (PAD) or poor blood flow. PVD can result in less blood flowing to the arms, legs, and organs such as the stomach or kidneys. It most often affects the lower legs and feet. Without treatment, PVD often gets worse. PVD can lead to acute limb ischemia. This happens when there's a sudden stop of blood flow to an arm or leg. This is a medical emergency. What are the causes? The most common cause of PVD is a buildup of a fatty substance called plaque inside the arteries. This decreases blood flow. Plaque can also break off and block blood flow in small arteries. Here are some other common causes of PVD: Blood clots inside the blood vessels. Injuries to blood vessels. Irritation and swelling of blood vessels. Sudden tightening, or spasms, of the blood vessel. What increases the risk? Using tobacco or nicotine products. A family history of PVD. Common medical conditions, including: High cholesterol. Diabetes. High blood pressure. Heart disease. Other conditions, such as: Buerger's disease. This is caused by swollen and irritated blood vessels in your hands and feet. Past problems with blocked blood vessels. Kidney disease. Not getting enough exercise. Being very overweight. What are the signs or symptoms? Symptoms depend on what body part isn't getting enough blood. They may include: Cramps in your butt, legs, and feet. Pain and weakness in your legs when you're active that goes away when you rest. Leg pain when at rest along with leg numbness, tingling, or weakness. Coldness in a leg or foot, especially when compared to the other leg or foot. Skin or hair changes on the arms or legs, such as hair loss or  shiny skin. Erectile dysfunction. This means not being able to get or keep an erection. Weak pulse or no pulse in the feet. People with PVD are more likely to get open wounds and sores on their toes, feet, or legs. The sores may take longer than normal to heal. How is this diagnosed? PVD is diagnosed based on your symptoms, a physical exam, and your medical history. You may also have tests to find the cause. These may include: Ankle-brachial index test.This test compares the blood pressure readings of the legs and arms. Doppler ultrasound. This takes pictures of blood flow through your blood vessels. Imaging tests that use dye to show blood flow. These are: CT angiogram. Magnetic resonance angiogram, or MRA. How is this treated? The cause of your PVD is treated first. Other conditions, like diabetes or high cholesterol, are also treated. Treatment may include: Quitting smoking or tobacco use. Exercise programs that your health care provider says are right for you. Taking medicines, such as: Blood thinners to prevent blood clots. Medicines to improve blood flow. Medicines to improve cholesterol levels. Procedures to: Open a blocked artery and restore blood flow. This is called angioplasty. Make a new path for blood to flow around a blocked artery. This is called peripheral bypass surgery. Remove an affected leg or arm. This may be needed in cases of acute limb ischemia when other treatments haven't helped. Follow these instructions at home: Medicines Take your medicines only as told. If you're taking blood thinners: Take your medicines as told. Take them at the same time each day. Talk with  your provider before taking any products you can buy at the store. Do not do things that could hurt or bruise you. Be careful to avoid falls. Wear an alert bracelet or carry a card that says you take blood thinners. Lifestyle  Do not smoke, vape, or use nicotine or tobacco. Get regular exercise.  Ask your provider about good activities for you. Eat a diet that's low in fat and cholesterol. Talk with your provider about staying at a healthy weight. If needed, ask about losing weight. Do not drink alcohol. General instructions Take good care of your feet. To do this: Wear shoes that fit well and feel good. Check your feet often for any cuts or sores. Get vaccines as recommended by your provider. Where to find more information American Heart Association: heart.org National Heart, Lung, and Blood Institute: [employer] Contact a health care provider if: You have cramps in your legs when you walk. You have leg pain when you're resting. Your leg or foot feels cold. Your skin changes color. You can't get or keep an erection. You have cuts or sores on your legs or feet that don't heal. Get help right away if: You have chest pain or trouble breathing. You have sudden changes in the color and feeling of your arms or legs, such as: Your arm or leg turns cold, numb, and blue. Your arm or leg becomes red, warm, swollen, painful, or numb. You have any signs of a stroke. "BE FAST" is an easy way to remember the main warning signs: B - Balance. Feeling dizzy, sudden trouble walking, or loss of balance. E - Eyes. Trouble seeing or a change in how you see. F - Face. Sudden weakness or feeling numb in the face. The face or eyelid may droop on one side. A - Arms. Weakness or loss of feeling in an arm. This happens fast and often only on one side. S - Speech. Sudden trouble speaking, slurred speech, or trouble understanding what people say. T - Time. Time to call 911. Write down what time symptoms started. Other signs of a stroke can be: A sudden, very bad headache with no known cause. Feeling like you may throw up. Throwing up. These symptoms may be an emergency. Call 911 right away. Do not wait to see if the symptoms will go away. Do not drive yourself to the hospital. This information  is not intended to replace advice given to you by your health care provider. Make sure you discuss any questions you have with your health care provider. Document Revised: 12/11/2022 Document Reviewed: 07/10/2022 Elsevier Patient Education  2024 [employer].

## 2023-05-29 NOTE — Assessment & Plan Note (Signed)
 Represents a significant atherosclerotic risk factor and cessation would be of benefit long-term for patency of any surgery or intervention.

## 2023-05-29 NOTE — Assessment & Plan Note (Signed)
 blood pressure control important in reducing the progression of atherosclerotic disease. On appropriate oral medications.

## 2023-06-06 ENCOUNTER — Encounter
Admission: RE | Admit: 2023-06-06 | Discharge: 2023-06-06 | Disposition: A | Discharge status: Home / Self Care | Source: Ambulatory Visit | Attending: Vascular Surgery | Admitting: Vascular Surgery

## 2023-06-06 ENCOUNTER — Other Ambulatory Visit: Payer: Self-pay

## 2023-06-06 ENCOUNTER — Other Ambulatory Visit (INDEPENDENT_AMBULATORY_CARE_PROVIDER_SITE_OTHER): Payer: Self-pay | Admitting: Nurse Practitioner

## 2023-06-06 VITALS — BP 119/65 | HR 63 | Resp 16 | Wt 126.3 lb

## 2023-06-06 DIAGNOSIS — I10 Essential (primary) hypertension: Secondary | ICD-10-CM | POA: Insufficient documentation

## 2023-06-06 DIAGNOSIS — I70223 Atherosclerosis of native arteries of extremities with rest pain, bilateral legs: Secondary | ICD-10-CM

## 2023-06-06 DIAGNOSIS — Z01818 Encounter for other preprocedural examination: Secondary | ICD-10-CM | POA: Diagnosis present

## 2023-06-06 DIAGNOSIS — I213 ST elevation (STEMI) myocardial infarction of unspecified site: Secondary | ICD-10-CM | POA: Diagnosis not present

## 2023-06-06 DIAGNOSIS — Z01812 Encounter for preprocedural laboratory examination: Secondary | ICD-10-CM

## 2023-06-06 DIAGNOSIS — I255 Ischemic cardiomyopathy: Secondary | ICD-10-CM | POA: Insufficient documentation

## 2023-06-06 HISTORY — DX: Unspecified convulsions: R56.9

## 2023-06-06 HISTORY — DX: Depression, unspecified: F32.A

## 2023-06-06 HISTORY — DX: Anxiety disorder, unspecified: F41.9

## 2023-06-06 HISTORY — DX: Angina pectoris, unspecified: I20.9

## 2023-06-06 HISTORY — DX: Dyspnea, unspecified: R06.00

## 2023-06-06 HISTORY — DX: Thrombosis of atrium, auricular appendage, and ventricle as current complications following acute myocardial infarction: I23.6

## 2023-06-06 LAB — CBC WITH DIFFERENTIAL/PLATELET
Abs Immature Granulocytes: 0.02 10*3/uL (ref 0.00–0.07)
Basophils Absolute: 0.1 10*3/uL (ref 0.0–0.1)
Basophils Relative: 1 %
Eosinophils Absolute: 0.2 10*3/uL (ref 0.0–0.5)
Eosinophils Relative: 3 %
HCT: 44.8 % (ref 36.0–46.0)
Hemoglobin: 15.2 g/dL — ABNORMAL HIGH (ref 12.0–15.0)
Immature Granulocytes: 0 %
Lymphocytes Relative: 31 %
Lymphs Abs: 2 10*3/uL (ref 0.7–4.0)
MCH: 35.5 pg — ABNORMAL HIGH (ref 26.0–34.0)
MCHC: 33.9 g/dL (ref 30.0–36.0)
MCV: 104.7 fL — ABNORMAL HIGH (ref 80.0–100.0)
Monocytes Absolute: 0.5 10*3/uL (ref 0.1–1.0)
Monocytes Relative: 7 %
Neutro Abs: 3.6 10*3/uL (ref 1.7–7.7)
Neutrophils Relative %: 58 %
Platelets: 186 10*3/uL (ref 150–400)
RBC: 4.28 MIL/uL (ref 3.87–5.11)
RDW: 12.7 % (ref 11.5–15.5)
WBC: 6.3 10*3/uL (ref 4.0–10.5)
nRBC: 0 % (ref 0.0–0.2)

## 2023-06-06 LAB — BASIC METABOLIC PANEL WITH GFR
Anion gap: 8 (ref 5–15)
BUN: 15 mg/dL (ref 8–23)
CO2: 25 mmol/L (ref 22–32)
Calcium: 9.5 mg/dL (ref 8.9–10.3)
Chloride: 107 mmol/L (ref 98–111)
Creatinine, Ser: 0.63 mg/dL (ref 0.44–1.00)
GFR, Estimated: >60 mL/min (ref >60)
Glucose, Bld: 77 mg/dL (ref 70–99)
Potassium: 3.8 mmol/L (ref 3.5–5.1)
Sodium: 140 mmol/L (ref 135–145)

## 2023-06-06 LAB — TYPE AND SCREEN
ABO/RH(D): A POS
Antibody Screen: NEGATIVE

## 2023-06-06 LAB — SURGICAL PCR SCREEN
MRSA, PCR: NEGATIVE
Staphylococcus aureus: NEGATIVE

## 2023-06-06 NOTE — Patient Instructions (Addendum)
 Your procedure is scheduled on: 06/12/23 - Wednesday Report to the Registration Desk on the 1st floor of the [facility]. To find out your arrival time, please call [phone] between 1PM - 3PM on: 06/11/23 - Tuesday If your arrival time is 6:00 am, do not arrive before that time as the [facility] entrance doors do not open until 6:00 am.  REMEMBER: Instructions that are not followed completely may result in serious medical risk, up to and including death; or upon the discretion of your surgeon and anesthesiologist your surgery may need to be rescheduled.  Do not eat food or drink any fluids after midnight the night before surgery.  No gum chewing or hard candies.  One week prior to surgery: Stop Anti-inflammatories (NSAIDS) such as Advil, Aleve, Ibuprofen, Motrin, Naproxen, Naprosyn and Aspirin  based products such as Excedrin, Goody's Powder, BC Powder. You may take Tylenol  if needed for pain up until the day of surgery.  Stop ANY OVER THE COUNTER supplements until after surgery.  clopidogrel  (PLAVIX ) - hold 7 days prior to your procedure.  Hold lisinopril , aspirin , and spironolactone  on the morning of surgery.  ON THE DAY OF SURGERY ONLY TAKE THESE MEDICATIONS WITH SIPS OF WATER:  gabapentin  (NEURONTIN )  metoprolol  succinate (TOPROL  XL)    No Alcohol for 24 hours before or after surgery.  No Smoking including e-cigarettes for 24 hours before surgery.  No chewable tobacco products for at least 6 hours before surgery.  No nicotine patches on the day of surgery.  Do not use any "recreational" drugs for at least a week (preferably 2 weeks) before your surgery.  Please be advised that the combination of cocaine and anesthesia may have negative outcomes, up to and including death. If you test positive for cocaine, your surgery will be cancelled.  On the morning of surgery brush your teeth with toothpaste and water, you may rinse your mouth with mouthwash if you wish. Do not  swallow any toothpaste or mouthwash.  Use CHG Soap or wipes as directed on instruction sheet.  Do not wear jewelry, make-up, hairpins, clips or nail polish.  For welded (permanent) jewelry: bracelets, anklets, waist bands, etc.  Please have this removed prior to surgery.  If it is not removed, there is a chance that hospital personnel will need to cut it off on the day of surgery.  Do not wear lotions, powders, or perfumes.   Do not shave body hair from the neck down 48 hours before surgery.  Contact lenses, hearing aids and dentures may not be worn into surgery.  Do not bring valuables to the hospital. [facility] is not responsible for any missing/lost belongings or valuables.   Notify your doctor if there is any change in your medical condition (cold, fever, infection).  Wear comfortable clothing (specific to your surgery type) to the hospital.  After surgery, you can help prevent lung complications by doing breathing exercises.  Take deep breaths and cough every 1-2 hours. Your doctor may order a device called an Incentive Spirometer to help you take deep breaths.  When coughing or sneezing, hold a pillow firmly against your incision with both hands. This is called "splinting." Doing this helps protect your incision. It also decreases belly discomfort.  If you are being admitted to the hospital overnight, leave your suitcase in the car. After surgery it may be brought to your room.  In case of increased patient census, it may be necessary for you, the patient, to continue your postoperative  care in the Same Day Surgery department.  If you are being discharged the day of surgery, you will not be allowed to drive home. You will need a responsible individual to drive you home and stay with you for 24 hours after surgery.   If you are taking public transportation, you will need to have a responsible individual with you.  Please call the Pre-admissions Testing Dept. at [phone]  if you have any questions about these instructions.  Surgery Visitation Policy:  Patients having surgery or a procedure may have two visitors.  Children under the age of 37 must have an adult with them who is not the patient.  Inpatient Visitation:    Visiting hours are 7 a.m. to 8 p.m. Up to four visitors are allowed at one time in a patient room. The visitors may rotate out with other people during the day.  One visitor age 19 or older may stay with the patient overnight and must be in the room by 8 p.m.    Preparing for Surgery with CHLORHEXIDINE GLUCONATE (CHG) Soap  Chlorhexidine Gluconate (CHG) Soap  o An antiseptic cleaner that kills germs and bonds with the skin to continue killing germs even after washing  o Used for showering the night before surgery and morning of surgery  Before surgery, you can play an important role by reducing the number of germs on your skin.  CHG (Chlorhexidine gluconate) soap is an antiseptic cleanser which kills germs and bonds with the skin to continue killing germs even after washing.  Please do not use if you have an allergy to CHG or antibacterial soaps. If your skin becomes reddened/irritated stop using the CHG.  1. Shower the NIGHT BEFORE SURGERY and the MORNING OF SURGERY with CHG soap.  2. If you choose to wash your hair, wash your hair first as usual with your normal shampoo.  3. After shampooing, rinse your hair and body thoroughly to remove the shampoo.  4. Use CHG as you would any other liquid soap. You can apply CHG directly to the skin and wash gently with a scrungie or a clean washcloth.  5. Apply the CHG soap to your body only from the neck down. Do not use on open wounds or open sores. Avoid contact with your eyes, ears, mouth, and genitals (private parts). Wash face and genitals (private parts) with your normal soap.  6. Wash thoroughly, paying special attention to the area where your surgery will be performed.  7.  Thoroughly rinse your body with warm water.  8. Do not shower/wash with your normal soap after using and rinsing off the CHG soap.  9. Pat yourself dry with a clean towel.  10. Wear clean pajamas to bed the night before surgery.  12. Place clean sheets on your bed the night of your first shower and do not sleep with pets.  13. Shower again with the CHG soap on the day of surgery prior to arriving at the hospital.  14. Do not apply any deodorants/lotions/powders.  15. Please wear clean clothes to the hospital.

## 2023-06-10 ENCOUNTER — Encounter: Payer: Self-pay | Admitting: Vascular Surgery

## 2023-06-10 DIAGNOSIS — Z7982 Long term (current) use of aspirin: Secondary | ICD-10-CM

## 2023-06-10 NOTE — Progress Notes (Incomplete)
 Perioperative / Anesthesia Services  Pre-Admission Testing Clinical Review / Pre-Operative Anesthesia Consult  Date: 06/10/23  Patient Demographics:  Name: Brianna Roth DOB: [birth year] MRN:   [id number]  Planned Surgical Procedure(s):    Case: [id number] Date/Time: 06/12/23 0715   Procedures:      ENDARTERECTOMY, FEMORAL (Left)     APPLICATION OF CELL SAVER   Anesthesia type: General   Diagnosis: Atherosclerosis of native artery of left lower extremity with rest pain (HCC) [I70.222]   Pre-op diagnosis: ASO WITH REST PAIN   Location: [facility] OR ROOM 08 / [facility] ORS FOR ANESTHESIA GROUP   Surgeons: Celso College, MD      NOTE: Available PAT nursing documentation and vital signs have been reviewed. Clinical nursing staff has updated patient's PMH/PSHx, current medication list, and drug allergies/intolerances to ensure comprehensive history available to assist in medical decision making as it pertains to the aforementioned surgical procedure and anticipated anesthetic course. Extensive review of available clinical information personally performed. [place] [facility] and PSHx updated with any diagnoses/procedures that  may have been inadvertently omitted during his intake with the pre-admission testing department's nursing staff.  Clinical Discussion:  Brianna Roth is a 63 y.o. female who is submitted for pre-surgical anesthesia review and clearance prior to her undergoing the above procedure. Patient is a Current Smoker (6.3 pack years). 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  Patient is followed by cardiology Beau Bound, MD). She was last seen in the cardiology clinic on 02/25/2023; notes reviewed. At the time of her clinic visit, patient doing well overall from a  cardiovascular perspective. Patient denied any chest pain, shortness of breath, PND, orthopnea, palpitations, significant peripheral edema, weakness, fatigue, vertiginous symptoms, or presyncope/syncope. Patient with a past medical history significant for cardiovascular diagnoses. Documented physical exam was grossly benign, providing no evidence of acute exacerbation and/or decompensation of the patient's known cardiovascular conditions.  Patient suffered an anterolateral STEMI on 06/09/2008.  She initially presented for treatment here at [facility]. Due to concurrent altered mental status concerning for CVA, intervention was delayed.    MRI imaging of the brain was performed on 06/09/2008 revealing areas of encephalomalacia involving the RIGHT frontal operculum and high RIGHT frontoparietal region consistent with sequela of remote infarction.  Patient was transferred to [facility] on 06/10/2008 where she underwent diagnostic LEFT heart catheterization revealing multivessel CAD; 100% mid LAD, 50% mid RCA, 10% ostial LM, 30% ramus intermedius, and 90% D2.  Patient with severe post infarct ischemic cardiomyopathy with EF of 25%.  There was anterior wall akinesis noted.. She subsequently underwent mechanical thrombectomy and PCI placing a 2.75 x 24 mm micro Driver BMS to the mid LAD lesion.  Procedure yielded excellent angiographic result and TIMI-3 flow.  Following anterolateral STEMI, patient underwent cardiovascular MRI on 06/14/2008 demonstrating a akinetic mid ventricular anterior, anteroseptal, and apex consistent with known LAD perfusion territory infarct.  Global systolic function significantly reduced with a visual LVEF of 30-35%.  Right ventricular size and function normal.  Atria were unremarkable.  There were no significant valvular abnormalities.  Delayed enhancement for viability was abnormal.  There was a large myocardial infarction in the  mid anterior and anteroseptal location from the mid ventricular level and encompassing the entire apex.  Infarct was nearly transmural throughout.  Several intracavitary thrombi  ranging from 3 to 10 mm in diameter were seen in the left ventricular apex.  Patient suffered an inferior STEMI on 06/19/2013.  Patient was transferred to [facility] where she underwent diagnostic LEFT heart catheterization on 06/20/2013 revealing multivessel CAD; 100% lateral ramus intermedius, 50% mid RCA, and 99% distal RCA.  PCI was performed placing an Xience Alpine DES x 1 to the distal RCA.  Procedure yielded excellent angiographic result and TIMI-3 flow.  Patient suffered a second inferior STEMI on 03/16/2018.  She underwent diagnostic LEFT heart catheterization revealing multivessel CAD 70% mid RCA, 100% distal RCA, 75% ostial RPDA, and 30% proximal to mid LAD.  PCI was subsequently performed placing a 2.75 x 15 mm Resolute Onyx DES x 1 to the mid RCA and overlapping 2.25 x 16 mm and 2.25 x 18 mm Resolute Onyx stents into the distal RCA.  Again, patient with post infarct ischemic cardiomyopathy. Most recent TTE performed on 05/04/2019 revealed a normal left ventricular systolic function with an EF of 30-35%.  The were wall motion abnormalities:  apical lateral segment, apical anterior segment, and apex are akinetic. Additionally, the basal anteroseptal segment, basal anterolateral segment, and basal anterior segment are hypokinetic.  Left ventricular internal cavity size moderately dilated.  Left ventricular diastolic Doppler parameters were normal.  There was mild biatrial enlargement.  Right ventricle was mildly enlarged with low normal systolic function; TAPSE = 3.1 cm (normal range >/= 1.6 cm).  PASP/RVSP was normal.  There was mild mitral and tricuspid valve regurgitation. All transvalvular gradients were noted to be normal providing no evidence suggestive of valvular stenosis. Aorta normal in size  with appreciable ectasia or aneurysmal dilatation.  Most recent myocardial perfusion imaging study was performed on 05/04/2019 severely reduced left ventricular systolic function with an EF of 25%.  There was basal anterior, basal inferolateral, and basal anterolateral, mid anterior, mid anterolateral, apical anterior, apical lateral, and apex akinesis.  No artifact or left ventricular cavity size enlargement appreciated on review of imaging. SPECT images demonstrated no evidence of stress-induced myocardial ischemia or arrhythmia; no scintigraphic evidence of scar.  TID ratio = 1.13.  Baseline heart rate 50 bpm, which increased to 120 bpm with stress; 74% MPHR.  Patient demonstrated a mild hypertensive response to exercise increasing from a systolic pressure of 135 to 152 mmHg.  Patient able to achieve 1 METS of physical activity during the study.  Overall, study determined to be high risk.  Patient underwent lower extremity angiography study on 05/13/2023 revealing >80% stenosis of the RIGHT common iliac artery.  Additionally, there was 95% stenosis of the LEFT common femoral artery and <30% stenosis of the LEFT superficial femoral and popliteal arteries.  Given the degree of stenosis in the left lower extremity, patient will require revascularization.  Given the presence of an older generation bare-metal stent, in conjunction with patient's significant PVD diagnosis, patient remains on daily DAPT therapy using ASA + clopidogrel .  Patient reportedly compliant with therapy with no evidence or reports of GI/GU related bleeding.  Blood pressure well-controlled at 136/84 mmHg on currently prescribed ACEi (lisinopril ), diuretic (spironolactone ), and beta-blocker (metoprolol  succinate) therapies.  Patient has a supply of short acting nitrates (NTG) to use on a as needed basis for recurrent angina/anginal equivalent symptoms; denied recent use.  Patient is on atorvastatin  for HLD diagnosis and further ASCVD  prevention.  Patient is not diabetic.  She does not have an OSAH diagnosis. Patient is able to complete all of her  ADL/IADLs  without cardiovascular limitation.  Per the DASI, patient is able to achieve at least 4 METS of physical activity without experiencing any significant degree of angina/anginal equivalent symptoms. No changes were made to her medication regimen during her visit with cardiology.  Patient scheduled to follow-up with outpatient cardiology in 6 months or sooner if needed.  Monzerath Pendell Elsey is scheduled for an elective ENDARTERECTOMY, FEMORAL (Left) on 06/12/2023 with Dr. Mikki Alexander, MD.  Given patient's past medical history significant for cardiovascular diagnoses, presurgical cardiac clearance was sought by the PAT team. Per cardiology, "this patient is optimized for surgery and may proceed with the planned procedural course with a MODERATE risk of significant perioperative cardiovascular complications".  Again, this patient is on daily oral antithrombotic therapy using low-dose ASA and clopidogrel .  Per directives from vascular surgery, patient will hold his daily clopidogrel  dose for 5 days prior to his procedure with plans to restart as soon as postoperative bleeding risk felt to be minimized by his primary attending surgeon.  Patient is aware that his last dose of clopidogrel  should be on 06/06/2023. Given that patient's past medical history is significant for cardiovascular diagnoses, including but not limited to CAD, vascular surgery has cleared patient to continue her daily low dose ASA throughout her perioperative course. She will be asked to hold her normal dose on the day of her procedure only. Patient has been updated on these directives from her specialty care providers by the PAT team.  Patient denies previous perioperative complications with anesthesia in the past. In review her EMR, there are no records available for review pertaining to any anesthetic courses within the  [facility] in the recent past.      06/06/2023    1:06 PM 05/28/2023    1:56 PM 05/13/2023    1:00 PM  Vitals with BMI  Height  5\' 3"    Weight 126 lbs 5 oz 127 lbs 2 oz   BMI 22.38 22.52   Systolic 119 113 409  Diastolic 65 72 75  Pulse 63 61    Providers/Specialists:  NOTE: Primary physician provider listed below. Patient may have been seen by APP or partner within same practice.   PROVIDER ROLE / SPECIALTY LAST Juel Nutley, MD Vascular Surgery (Surgeon) 05/28/2023  Antonette Batters, MD Primary Care Provider 02/25/2023  Denzil Flatten, MD Cardiology 02/25/2023   Allergies:  No Known Allergies Current Home Medications:   No current facility-administered medications for this encounter.    aspirin  81 MG chewable tablet   atorvastatin  (LIPITOR) 40 MG tablet   clopidogrel  (PLAVIX ) 75 MG tablet   gabapentin  (NEURONTIN ) 300 MG capsule   lisinopril  (PRINIVIL ,ZESTRIL ) 5 MG tablet   metoprolol  succinate (TOPROL  XL) 25 MG 24 hr tablet   nitroGLYCERIN  (NITROSTAT ) 0.4 MG SL tablet   oxyCODONE -acetaminophen  (PERCOCET) 5-325 MG tablet   spironolactone  (ALDACTONE ) 25 MG tablet   History:   Past Medical History:  Diagnosis Date   Anginal pain (HCC)    Anxiety    Aortic atherosclerosis (HCC)    Asthma    Cancer (HCC)    Coronary artery disease 06/09/2008   a.) antlat STEMI 06/09/2008 -> LHC/PCI 06/10/2008: 100 mLAD (thrombectomy + 2.75 x 24mm Micro-driver BMS), 50 mRCA, 10 oLM, 30 RI, 90 D2; b.) inf STEMI 06/19/2013 - LHC/PCI 06/20/2013: 100 lat RI, 50 mRCA, 99 dRCA (Xience Alpine DES); c.) inf STEMI 03/16/2018 -> LHC/PCI: 70 mRCA (2.75 x 15mm Resolute Onyx DES), 100 dRCA (overlapping 2.25 x  16mm + 2.25 x 18mm Resolute Onyx DES), 75 oRPDA, 30 p-mLAD   Depression    Dyspnea    Febrile seizures (during childhood)    HFrEF (heart failure with reduced ejection fraction) (HCC) 05/2008   a.) due to post-infarct ischemia cardiomyopathy; EF by LHC 25% on 06/10/2008 with  anterior wall akinesis   History of heart artery stent    a.) TOTAL OF: 5 stents (LAD, m-dRCA) as of 06/11/2023   Hyperlipidemia    Hypertension    Ischemic cardiomyopathy 05/2008   a.) post-anterolateral infarct; EF 25% via LHC with anterior wall akinesis   Long term current use of clopidogrel     Long-term use of aspirin  therapy    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   PVD (peripheral vascular disease) (HCC)    ST elevation myocardial infarction (STEMI) of anterolateral wall (HCC) 06/09/2008   a.) anterolateral STEMI 06/09/2008 --> delayed intervention at [facility] d/t mental status changes and concerns for CVA; MRI showed old CVAs; b.) LCH/PCI 06/10/2008: 100% mLAD (mechanical thrombectomy + 2.75 x 24 mm Micro-driver BMS), 60% mRCA, 63% oLM, 30% RI, 90% D2   ST elevation myocardial infarction (STEMI) of inferior wall (HCC) 03/16/2018   a.) LHC/PCI 03/16/2018: 70% mRCA (2.75 x 15 mm Resolute Onyx DES), 100% dRCA (overlapping 2.25 x 16 mm and 2.25 x 18 mm Resolute Oxyx DES), 75% oRPDA, 30% p-mLAD   ST elevation myocardial infarction (STEMI) of inferior wall (HCC) 06/19/2013   a.) LHC/PCI 06/20/2013: 99% dRCA (Xience Alpine DES)   Stroke [facility])    a.) brain MRI 06/09/2008: areas of encephalomalacia involving the RIGHT frontal operculum and high RIGHT frontoparietal region c/w sequela of remote infarction   Tobacco abuse    Past Surgical History:  Procedure Laterality Date   ABDOMINAL HYSTERECTOMY     BACK SURGERY     CORONARY ANGIOPLASTY     CORONARY ANGIOPLASTY WITH STENT PLACEMENT Left 06/10/2008   Procedure: CORONARY ANGIOPLASTY WITH STENT PLACEMENT; Location: [facility]   CORONARY ANGIOPLASTY WITH STENT PLACEMENT Left 06/20/2013   Procedure: CORONARY ANGIOPLASTY WITH STENT PLACEMENT; Location: [facility]   CORONARY/GRAFT ACUTE MI REVASCULARIZATION N/A 03/16/2018   Procedure: Coronary/Graft Acute MI Revascularization;  Surgeon: Percival Brace, MD;  Location: [facility]  INVASIVE CV LAB;  Service: Cardiovascular;  Laterality: N/A;   LEFT HEART CATH AND CORONARY ANGIOGRAPHY N/A 03/16/2018   Procedure: LEFT HEART CATH AND CORONARY ANGIOGRAPHY;  Surgeon: Percival Brace, MD;  Location: [facility] INVASIVE CV LAB;  Service: Cardiovascular;  Laterality: N/A;   LOWER EXTREMITY ANGIOGRAPHY Left 05/13/2023   Procedure: Lower Extremity Angiography;  Surgeon: Celso College, MD;  Location: [facility] INVASIVE CV LAB;  Service: Cardiovascular;  Laterality: Left;   THROAT SURGERY     THYROIDECTOMY     Family History  Problem Relation Age of Onset   Cancer Mother    Heart attack Father    Hypertension Daughter    Heart attack Daughter    Social History   Tobacco Use   Smoking status: Every Day    Current packs/day: 0.25    Average packs/day: 0.3 packs/day for 25.0 years (6.3 ttl pk-yrs)    Types: Cigarettes   Smokeless tobacco: Never  Substance Use Topics   Alcohol use: No   Pertinent Clinical Results:  LABS:  Hospital Outpatient Visit on 06/06/2023  Component Date Value Ref Range Status   ABO/RH(D) 06/06/2023 A POS   Final   Antibody Screen 06/06/2023 NEG   Final   Sample Expiration 06/06/2023  06/20/2023,2359   Final   Extend sample reason 06/06/2023    Final                   Value:NO TRANSFUSIONS OR PREGNANCY IN THE PAST 3 MONTHS Performed at [facility], [address] [place], [place] [address]    WBC 06/06/2023 6.3  4.0 - 10.5 K/uL Final   RBC 06/06/2023 4.28  3.87 - 5.11 MIL/uL Final   Hemoglobin 06/06/2023 15.2 (H)  12.0 - 15.0 g/dL Final   HCT 30/86/5784 44.8  36.0 - 46.0 % Final   MCV 06/06/2023 104.7 (H)  80.0 - 100.0 fL Final   MCH 06/06/2023 35.5 (H)  26.0 - 34.0 pg Final   MCHC 06/06/2023 33.9  30.0 - 36.0 g/dL Final   RDW 69/62/9528 12.7  11.5 - 15.5 % Final   Platelets 06/06/2023 186  150 - 400 K/uL Final   nRBC 06/06/2023 0.0  0.0 - 0.2 % Final   Neutrophils Relative % 06/06/2023 58  % Final   Neutro Abs 06/06/2023 3.6  1.7 -  7.7 K/uL Final   Lymphocytes Relative 06/06/2023 31  % Final   Lymphs Abs 06/06/2023 2.0  0.7 - 4.0 K/uL Final   Monocytes Relative 06/06/2023 7  % Final   Monocytes Absolute 06/06/2023 0.5  0.1 - 1.0 K/uL Final   Eosinophils Relative 06/06/2023 3  % Final   Eosinophils Absolute 06/06/2023 0.2  0.0 - 0.5 K/uL Final   Basophils Relative 06/06/2023 1  % Final   Basophils Absolute 06/06/2023 0.1  0.0 - 0.1 K/uL Final   Immature Granulocytes 06/06/2023 0  % Final   Abs Immature Granulocytes 06/06/2023 0.02  0.00 - 0.07 K/uL Final   Performed at [facility], [address] [place], [place] [address]   Sodium 06/06/2023 140  135 - 145 mmol/L Final   Potassium 06/06/2023 3.8  3.5 - 5.1 mmol/L Final   Chloride 06/06/2023 107  98 - 111 mmol/L Final   CO2 06/06/2023 25  22 - 32 mmol/L Final   Glucose, Bld 06/06/2023 77  70 - 99 mg/dL Final   Glucose reference range applies only to samples taken after fasting for at least 8 hours.   BUN 06/06/2023 15  8 - 23 mg/dL Final   Creatinine, Ser 06/06/2023 0.63  0.44 - 1.00 mg/dL Final   Calcium  06/06/2023 9.5  8.9 - 10.3 mg/dL Final   GFR, Estimated 06/06/2023 >60  >60 mL/min Final   Comment: (NOTE) Calculated using the CKD-EPI Creatinine Equation (2021)    Anion gap 06/06/2023 8  5 - 15 Final   Performed at [facility], [address] [place], [place] [address]   MRSA, PCR 06/06/2023 NEGATIVE  NEGATIVE Final   Staphylococcus aureus 06/06/2023 NEGATIVE  NEGATIVE Final   Comment: (NOTE) The Xpert SA Assay (FDA approved for NASAL specimens in patients 57 years of age and older), is one component of a comprehensive surveillance program. It is not intended to diagnose infection nor to guide or monitor treatment. Performed at [facility], [address] [place], [place] [address]     ECG: Date: 06/06/2023  Time ECG obtained: 1020 AM Rate: 56 bpm Rhythm: sinus bradycardia Axis (leads I and aVF):  left Intervals: PR 166 ms. QRS 102 ms. QTc 418 ms. ST segment and T wave changes: Inferolateral T wave inversions noted in leads II, III, aVF, and V4-V6.  Nonspecific precordial ST changes.   Evidence of a  possible, age undetermined, prior infarct:  Yes; inferior and anteroseptal Comparison: Similar to previous tracing obtained on 03/17/2018   IMAGING / PROCEDURES: VAS US  ABI WITH/WO TBI performed on 05/28/2023 Resting right ankle-brachial index is within normal range. The right toe-brachial index is normal.  Resting left ankle-brachial index indicates mild left lower extremity arterial disease. The left toe-brachial index is normal.    MYOCARDIAL PERFUSION IMAGING STUDY (LEXISCAN ) performed on 05/04/2019 The left ventricular ejection fraction is severely decreased (<30%). Nuclear stress EF: 25%. Blood pressure demonstrated a normal response to exercise. There was no ST segment deviation noted during stress. No T wave inversion was noted during stress. Findings consistent with prior myocardial infarction. This is a high risk study.  TRANSTHORACIC ECHOCARDIOGRAM performed on 05/04/2019 Left ventricular ejection fraction, by estimation, is 30 to 35%. The left ventricle has moderately decreased function. The left ventricle demonstrates regional wall motion abnormalities. The left ventricular internal cavity size was moderately dilated. There is mild left ventricular hypertrophy. Left ventricular diastolic parameters were normal.  Right ventricular systolic function is low normal. The right ventricular size is mildly enlarged. mildly increased right ventricular wall thickness. There is normal pulmonary artery systolic pressure.  Left atrial size was mildly dilated.  Right atrial size was mildly dilated.  The mitral valve is grossly normal. Mild mitral valve regurgitation.  The aortic valve is grossly normal. Aortic valve regurgitation is not visualized.   LEFT HEART CATHETERIZATION AND  CORONARY ANGIOGRAPHY performed on 03/16/2018 Inferior wall STEMI Severe dilated cardiomyopathy with LVEF of 25-30% Multivessel CAD 100% distal RCA 70% mid RCA 75% ostial RPDA 30% proximal to mid LAD Previously placed stent to the mid LAD patent Successful PCI 2.75 x 15 mm Resolute Onyx DES x 1 to the mid RCA Overlapping 2.25 x 16 mm and 2.25 x 18 mm Resolute Onyx DES x 2 to the distal RCA Procedure yielded excellent angiographic result and restoration of TIMI-3 flow     Impression and Plan:  Ruperta Redler Sendejo has been referred for pre-anesthesia review and clearance prior to her undergoing the planned anesthetic and procedural courses. Available labs, pertinent testing, and imaging results were personally reviewed by me in preparation for upcoming operative/procedural course. [facility] medical record has been updated following extensive record review and patient interview with PAT staff.   This patient has been appropriately cleared by cardiology with an overall MODERATE risk of patient experiencing significant perioperative cardiovascular complications. Based on clinical review performed today (06/10/23), barring any significant acute changes in the patient's overall condition, it is anticipated that she will be able to proceed with the planned surgical intervention. Any acute changes in clinical condition may necessitate her procedure being postponed and/or cancelled. Patient will meet with anesthesia team (MD and/or CRNA) on the day of her procedure for preoperative evaluation/assessment. Questions regarding anesthetic course will be fielded at that time.   Pre-surgical instructions were reviewed with the patient during his PAT appointment, and questions were fielded to satisfaction by PAT clinical staff. She has been instructed on which medications that she will need to hold prior to surgery, as well as the ones that have been deemed safe/appropriate to take on the day of her procedure.  As part of the general education provided by PAT, patient made aware both verbally and in writing, that she would need to abstain from the use of any illegal substances during her perioperative course. She was advised that failure to follow the provided instructions could necessitate case cancellation or result  in serious perioperative complications up to and including death. Patient encouraged to contact PAT and/or her surgeon's office to discuss any questions or concerns that may arise prior to surgery; verbalized understanding.   Renate Caroline, MSN, APRN, FNP-C, CEN [facility]  Perioperative Services Nurse Practitioner Phone: [phone] Fax: [phone] 06/10/23 3:33 PM  NOTE: This note has been prepared using Dragon dictation software. Despite my best ability to proofread, there is always the potential that unintentional transcriptional errors may still occur from this process.

## 2023-06-11 ENCOUNTER — Encounter: Payer: Self-pay | Admitting: Vascular Surgery

## 2023-06-11 NOTE — Progress Notes (Signed)
 This pt's daughter, Brianna Roth called to notify SDS that her mom did not stop taking her Plavix  for 7 days, the pt stopped her Plavix  2 days ago. Dr Vonna Guardian notified and stated that he would proceed with surgery tomorrow 06/12/23.

## 2023-06-12 ENCOUNTER — Encounter
Admission: RE | Disposition: A | Discharge status: Home / Self Care | Payer: Self-pay | Source: Home / Self Care | Attending: Vascular Surgery

## 2023-06-12 ENCOUNTER — Inpatient Hospital Stay: Payer: Self-pay | Admitting: Urgent Care

## 2023-06-12 ENCOUNTER — Encounter: Payer: Self-pay | Admitting: Vascular Surgery

## 2023-06-12 ENCOUNTER — Inpatient Hospital Stay
Admission: RE | Admit: 2023-06-12 | Discharge: 2023-06-17 | DRG: 253 | Disposition: A | Discharge status: Home / Self Care | Attending: Vascular Surgery | Admitting: Vascular Surgery

## 2023-06-12 ENCOUNTER — Other Ambulatory Visit: Payer: Self-pay

## 2023-06-12 DIAGNOSIS — E785 Hyperlipidemia, unspecified: Secondary | ICD-10-CM | POA: Diagnosis present

## 2023-06-12 DIAGNOSIS — Z8673 Personal history of transient ischemic attack (TIA), and cerebral infarction without residual deficits: Secondary | ICD-10-CM | POA: Diagnosis not present

## 2023-06-12 DIAGNOSIS — I2510 Atherosclerotic heart disease of native coronary artery without angina pectoris: Secondary | ICD-10-CM | POA: Diagnosis present

## 2023-06-12 DIAGNOSIS — Z79899 Other long term (current) drug therapy: Secondary | ICD-10-CM

## 2023-06-12 DIAGNOSIS — I255 Ischemic cardiomyopathy: Secondary | ICD-10-CM | POA: Diagnosis present

## 2023-06-12 DIAGNOSIS — Z955 Presence of coronary angioplasty implant and graft: Secondary | ICD-10-CM

## 2023-06-12 DIAGNOSIS — I252 Old myocardial infarction: Secondary | ICD-10-CM

## 2023-06-12 DIAGNOSIS — I5022 Chronic systolic (congestive) heart failure: Secondary | ICD-10-CM | POA: Diagnosis present

## 2023-06-12 DIAGNOSIS — F17210 Nicotine dependence, cigarettes, uncomplicated: Secondary | ICD-10-CM | POA: Diagnosis present

## 2023-06-12 DIAGNOSIS — Z8249 Family history of ischemic heart disease and other diseases of the circulatory system: Secondary | ICD-10-CM | POA: Diagnosis not present

## 2023-06-12 DIAGNOSIS — F32A Depression, unspecified: Secondary | ICD-10-CM | POA: Diagnosis present

## 2023-06-12 DIAGNOSIS — I110 Hypertensive heart disease with heart failure: Secondary | ICD-10-CM | POA: Diagnosis present

## 2023-06-12 DIAGNOSIS — Z7902 Long term (current) use of antithrombotics/antiplatelets: Secondary | ICD-10-CM

## 2023-06-12 DIAGNOSIS — Z7982 Long term (current) use of aspirin: Secondary | ICD-10-CM

## 2023-06-12 DIAGNOSIS — Z90710 Acquired absence of both cervix and uterus: Secondary | ICD-10-CM | POA: Diagnosis not present

## 2023-06-12 DIAGNOSIS — I70222 Atherosclerosis of native arteries of extremities with rest pain, left leg: Secondary | ICD-10-CM | POA: Diagnosis present

## 2023-06-12 DIAGNOSIS — E890 Postprocedural hypothyroidism: Secondary | ICD-10-CM | POA: Diagnosis present

## 2023-06-12 DIAGNOSIS — Z9889 Other specified postprocedural states: Secondary | ICD-10-CM | POA: Diagnosis not present

## 2023-06-12 DIAGNOSIS — I70229 Atherosclerosis of native arteries of extremities with rest pain, unspecified extremity: Principal | ICD-10-CM | POA: Diagnosis present

## 2023-06-12 HISTORY — PX: APPLICATION OF CELL SAVER: SHX7529

## 2023-06-12 HISTORY — DX: Presence of coronary angioplasty implant and graft: Z95.5

## 2023-06-12 HISTORY — DX: Long term (current) use of aspirin: Z79.82

## 2023-06-12 HISTORY — DX: Peripheral vascular disease, unspecified: I73.9

## 2023-06-12 HISTORY — DX: Long term (current) use of antithrombotics/antiplatelets: Z79.02

## 2023-06-12 HISTORY — DX: Atherosclerosis of aorta: I70.0

## 2023-06-12 HISTORY — PX: ENDARTERECTOMY FEMORAL: SHX5804

## 2023-06-12 HISTORY — DX: Simple febrile convulsions: R56.00

## 2023-06-12 LAB — CBC
HCT: 37.8 % (ref 36.0–46.0)
HCT: 38.5 % (ref 36.0–46.0)
Hemoglobin: 12.9 g/dL (ref 12.0–15.0)
Hemoglobin: 13.4 g/dL (ref 12.0–15.0)
MCH: 36.1 pg — ABNORMAL HIGH (ref 26.0–34.0)
MCH: 36.1 pg — ABNORMAL HIGH (ref 26.0–34.0)
MCHC: 34.1 g/dL (ref 30.0–36.0)
MCHC: 34.8 g/dL (ref 30.0–36.0)
MCV: 103.8 fL — ABNORMAL HIGH (ref 80.0–100.0)
MCV: 105.9 fL — ABNORMAL HIGH (ref 80.0–100.0)
Platelets: 143 10*3/uL — ABNORMAL LOW (ref 150–400)
Platelets: 149 10*3/uL — ABNORMAL LOW (ref 150–400)
RBC: 3.57 MIL/uL — ABNORMAL LOW (ref 3.87–5.11)
RBC: 3.71 MIL/uL — ABNORMAL LOW (ref 3.87–5.11)
RDW: 12 % (ref 11.5–15.5)
RDW: 12.1 % (ref 11.5–15.5)
WBC: 7.3 10*3/uL (ref 4.0–10.5)
WBC: 8.5 10*3/uL (ref 4.0–10.5)
nRBC: 0 % (ref 0.0–0.2)
nRBC: 0 % (ref 0.0–0.2)

## 2023-06-12 LAB — CREATININE, SERUM
Creatinine, Ser: 0.65 mg/dL (ref 0.44–1.00)
GFR, Estimated: >60 mL/min (ref >60)

## 2023-06-12 LAB — BASIC METABOLIC PANEL WITH GFR
Anion gap: 6 (ref 5–15)
BUN: 9 mg/dL (ref 8–23)
CO2: 24 mmol/L (ref 22–32)
Calcium: 9.1 mg/dL (ref 8.9–10.3)
Chloride: 107 mmol/L (ref 98–111)
Creatinine, Ser: 0.57 mg/dL (ref 0.44–1.00)
GFR, Estimated: >60 mL/min (ref >60)
Glucose, Bld: 120 mg/dL — ABNORMAL HIGH (ref 70–99)
Potassium: 4.6 mmol/L (ref 3.5–5.1)
Sodium: 137 mmol/L (ref 135–145)

## 2023-06-12 LAB — ABO/RH: ABO/RH(D): A POS

## 2023-06-12 LAB — GLUCOSE, CAPILLARY: Glucose-Capillary: 145 mg/dL — ABNORMAL HIGH (ref 70–99)

## 2023-06-12 SURGERY — ENDARTERECTOMY, FEMORAL
Anesthesia: General

## 2023-06-12 MED ORDER — FENTANYL CITRATE (PF) 100 MCG/2ML IJ SOLN
INTRAMUSCULAR | Status: AC
  Filled 2023-06-12: qty 2

## 2023-06-12 MED ORDER — DOPAMINE-DEXTROSE 3.2-5 MG/ML-% IV SOLN: 3.0000 ug/kg/min | INTRAVENOUS | Status: DC

## 2023-06-12 MED ORDER — ATORVASTATIN CALCIUM 20 MG PO TABS
40.0000 mg | ORAL_TABLET | Freq: Every day | ORAL | Status: DC
  Administered 2023-06-12 – 2023-06-16 (×5): 40 mg via ORAL
  Filled 2023-06-12 (×5): qty 2

## 2023-06-12 MED ORDER — ETOMIDATE 2 MG/ML IV SOLN
INTRAVENOUS | Status: DC | PRN
Start: 2023-06-12 — End: 2023-06-12
  Administered 2023-06-12: 20 mg via INTRAVENOUS

## 2023-06-12 MED ORDER — PHENYLEPHRINE HCL-NACL 20-0.9 MG/250ML-% IV SOLN
INTRAVENOUS | Status: AC
  Filled 2023-06-12: qty 250

## 2023-06-12 MED ORDER — CEFAZOLIN SODIUM-DEXTROSE 2-4 GM/100ML-% IV SOLN
2.0000 g | INTRAVENOUS | Status: AC
  Administered 2023-06-12: 2 g via INTRAVENOUS

## 2023-06-12 MED ORDER — HEPARIN SODIUM (PORCINE) 1000 UNIT/ML IJ SOLN
INTRAMUSCULAR | Status: DC | PRN
  Administered 2023-06-12: 5000 [IU] via INTRAVENOUS

## 2023-06-12 MED ORDER — ASPIRIN 81 MG PO CHEW
81.0000 mg | CHEWABLE_TABLET | Freq: Every day | ORAL | Status: DC
  Administered 2023-06-13 – 2023-06-17 (×5): 81 mg via ORAL
  Filled 2023-06-12 (×5): qty 1

## 2023-06-12 MED ORDER — HEPARIN 30,000 UNITS/1000 ML (OHS) CELLSAVER SOLUTION
Status: AC
  Filled 2023-06-12: qty 1000

## 2023-06-12 MED ORDER — LIDOCAINE HCL (CARDIAC) PF 100 MG/5ML IV SOSY
PREFILLED_SYRINGE | INTRAVENOUS | Status: DC | PRN
  Administered 2023-06-12: 80 mg via INTRAVENOUS

## 2023-06-12 MED ORDER — HEMOSTATIC AGENTS (NO CHARGE) OPTIME
TOPICAL | Status: DC | PRN
  Administered 2023-06-12: 1 via TOPICAL

## 2023-06-12 MED ORDER — GUAIFENESIN-DM 100-10 MG/5ML PO SYRP: 15.0000 mL | ORAL_SOLUTION | ORAL | Status: DC | PRN

## 2023-06-12 MED ORDER — VISTASEAL 10 ML SINGLE DOSE KIT
PACK | CUTANEOUS | Status: DC | PRN
  Administered 2023-06-12: 4 mL via TOPICAL

## 2023-06-12 MED ORDER — HYDRALAZINE HCL 20 MG/ML IJ SOLN: 5.0000 mg | INTRAMUSCULAR | Status: DC | PRN

## 2023-06-12 MED ORDER — ALBUTEROL SULFATE HFA 108 (90 BASE) MCG/ACT IN AERS
INHALATION_SPRAY | RESPIRATORY_TRACT | Status: DC | PRN
  Administered 2023-06-12: 4 via RESPIRATORY_TRACT

## 2023-06-12 MED ORDER — ACETAMINOPHEN 10 MG/ML IV SOLN
INTRAVENOUS | Status: DC | PRN
  Administered 2023-06-12: 1000 mg via INTRAVENOUS

## 2023-06-12 MED ORDER — KETAMINE HCL 50 MG/5ML IJ SOSY
PREFILLED_SYRINGE | INTRAMUSCULAR | Status: AC
  Filled 2023-06-12: qty 5

## 2023-06-12 MED ORDER — SODIUM CHLORIDE 0.9 % IV SOLN: INTRAVENOUS | Status: DC

## 2023-06-12 MED ORDER — PHENOL 1.4 % MT LIQD: 1.0000 | OROMUCOSAL | Status: DC | PRN

## 2023-06-12 MED ORDER — SPIRONOLACTONE 25 MG PO TABS
25.0000 mg | ORAL_TABLET | Freq: Every day | ORAL | Status: DC
  Administered 2023-06-12 – 2023-06-17 (×6): 25 mg via ORAL
  Filled 2023-06-12 (×6): qty 1

## 2023-06-12 MED ORDER — SUGAMMADEX SODIUM 200 MG/2ML IV SOLN
INTRAVENOUS | Status: DC | PRN
  Administered 2023-06-12: 200 mg via INTRAVENOUS

## 2023-06-12 MED ORDER — VASHE WOUND IRRIGATION OPTIME
TOPICAL | Status: DC | PRN
  Administered 2023-06-12: 34 [oz_av] via TOPICAL

## 2023-06-12 MED ORDER — LIDOCAINE HCL (PF) 2 % IJ SOLN
INTRAMUSCULAR | Status: AC
  Filled 2023-06-12: qty 5

## 2023-06-12 MED ORDER — CEFAZOLIN SODIUM-DEXTROSE 2-4 GM/100ML-% IV SOLN
INTRAVENOUS | Status: AC
  Filled 2023-06-12: qty 100

## 2023-06-12 MED ORDER — PROPOFOL 10 MG/ML IV BOLUS
INTRAVENOUS | Status: AC
  Filled 2023-06-12: qty 40

## 2023-06-12 MED ORDER — HYDROMORPHONE HCL 1 MG/ML IJ SOLN
1.0000 mg | Freq: Once | INTRAMUSCULAR | Status: AC | PRN
  Administered 2023-06-17: 1 mg via INTRAVENOUS
  Filled 2023-06-12: qty 1

## 2023-06-12 MED ORDER — ACETAMINOPHEN 10 MG/ML IV SOLN
INTRAVENOUS | Status: AC
  Filled 2023-06-12: qty 100

## 2023-06-12 MED ORDER — LISINOPRIL 10 MG PO TABS
5.0000 mg | ORAL_TABLET | Freq: Every day | ORAL | Status: DC
  Administered 2023-06-12 – 2023-06-15 (×4): 5 mg via ORAL
  Filled 2023-06-12 (×4): qty 1

## 2023-06-12 MED ORDER — ENOXAPARIN SODIUM 40 MG/0.4ML IJ SOSY
40.0000 mg | PREFILLED_SYRINGE | INTRAMUSCULAR | Status: DC
  Administered 2023-06-13 – 2023-06-17 (×5): 40 mg via SUBCUTANEOUS
  Filled 2023-06-12 (×5): qty 0.4

## 2023-06-12 MED ORDER — SODIUM CHLORIDE 0.9 % IV SOLN
INTRAVENOUS | Status: DC | PRN
  Administered 2023-06-12: 501 mL via SURGICAL_CAVITY

## 2023-06-12 MED ORDER — GENTAMICIN SULFATE 40 MG/ML IJ SOLN
INTRAMUSCULAR | Status: AC
  Filled 2023-06-12: qty 2

## 2023-06-12 MED ORDER — LABETALOL HCL 5 MG/ML IV SOLN: 10.0000 mg | INTRAVENOUS | Status: DC | PRN

## 2023-06-12 MED ORDER — VANCOMYCIN HCL 1000 MG IV SOLR
INTRAVENOUS | Status: DC | PRN
  Administered 2023-06-12: 1000 mg

## 2023-06-12 MED ORDER — OXYCODONE HCL 5 MG/5ML PO SOLN: 5.0000 mg | Freq: Once | ORAL | Status: DC | PRN

## 2023-06-12 MED ORDER — HEPARIN SODIUM (PORCINE) 1000 UNIT/ML IJ SOLN
INTRAMUSCULAR | Status: AC
  Filled 2023-06-12: qty 10

## 2023-06-12 MED ORDER — MORPHINE SULFATE (PF) 2 MG/ML IV SOLN
2.0000 mg | INTRAVENOUS | Status: DC | PRN
  Administered 2023-06-13: 2 mg via INTRAVENOUS
  Filled 2023-06-12: qty 1

## 2023-06-12 MED ORDER — ALUM & MAG HYDROXIDE-SIMETH 200-200-20 MG/5ML PO SUSP: 15.0000 mL | ORAL | Status: DC | PRN

## 2023-06-12 MED ORDER — CHLORHEXIDINE GLUCONATE CLOTH 2 % EX PADS
6.0000 | MEDICATED_PAD | Freq: Every day | CUTANEOUS | Status: DC
  Administered 2023-06-12 – 2023-06-17 (×4): 6 via TOPICAL

## 2023-06-12 MED ORDER — PHENYLEPHRINE HCL-NACL 20-0.9 MG/250ML-% IV SOLN
INTRAVENOUS | Status: DC | PRN
  Administered 2023-06-12: 20 ug/min via INTRAVENOUS

## 2023-06-12 MED ORDER — SODIUM CHLORIDE 0.9 % IV SOLN: 500.0000 mL | Freq: Once | INTRAVENOUS | Status: DC | PRN

## 2023-06-12 MED ORDER — OXYCODONE-ACETAMINOPHEN 5-325 MG PO TABS
1.0000 | ORAL_TABLET | ORAL | Status: DC | PRN
  Administered 2023-06-12 – 2023-06-14 (×9): 1 via ORAL
  Administered 2023-06-15 – 2023-06-16 (×3): 2 via ORAL
  Filled 2023-06-12 (×2): qty 1
  Filled 2023-06-12 (×2): qty 2
  Filled 2023-06-12: qty 1
  Filled 2023-06-12: qty 2
  Filled 2023-06-12 (×4): qty 1
  Filled 2023-06-12: qty 2
  Filled 2023-06-12 (×2): qty 1

## 2023-06-12 MED ORDER — FENTANYL CITRATE (PF) 100 MCG/2ML IJ SOLN
INTRAMUSCULAR | Status: DC | PRN
  Administered 2023-06-12 (×4): 50 ug via INTRAVENOUS

## 2023-06-12 MED ORDER — VISTASEAL 4 ML SINGLE DOSE KIT
PACK | CUTANEOUS | Status: AC
  Filled 2023-06-12: qty 4

## 2023-06-12 MED ORDER — CHLORHEXIDINE GLUCONATE CLOTH 2 % EX PADS: 6.0000 | MEDICATED_PAD | Freq: Once | CUTANEOUS | Status: DC

## 2023-06-12 MED ORDER — DEXAMETHASONE SODIUM PHOSPHATE 10 MG/ML IJ SOLN
INTRAMUSCULAR | Status: DC | PRN
  Administered 2023-06-12: 10 mg via INTRAVENOUS
  Administered 2023-06-12: 50 mg via INTRAVENOUS

## 2023-06-12 MED ORDER — CHLORHEXIDINE GLUCONATE 0.12 % MT SOLN
OROMUCOSAL | Status: AC
  Filled 2023-06-12: qty 15

## 2023-06-12 MED ORDER — HEPARIN 30,000 UNITS/1000 ML (OHS) CELLSAVER SOLUTION
Status: AC | PRN
Start: 2023-06-12 — End: 2023-06-12
  Administered 2023-06-12: 1

## 2023-06-12 MED ORDER — CHLORHEXIDINE GLUCONATE 0.12 % MT SOLN
15.0000 mL | Freq: Once | OROMUCOSAL | Status: AC
  Administered 2023-06-12: 15 mL via OROMUCOSAL

## 2023-06-12 MED ORDER — ACETAMINOPHEN 325 MG PO TABS: 325.0000 mg | ORAL_TABLET | ORAL | Status: DC | PRN

## 2023-06-12 MED ORDER — LABETALOL HCL 5 MG/ML IV SOLN
INTRAVENOUS | Status: AC
  Filled 2023-06-12: qty 4

## 2023-06-12 MED ORDER — ONDANSETRON HCL 4 MG/2ML IJ SOLN
INTRAMUSCULAR | Status: AC
Start: 2023-06-12 — End: ?
  Filled 2023-06-12: qty 2

## 2023-06-12 MED ORDER — FENTANYL CITRATE (PF) 100 MCG/2ML IJ SOLN
25.0000 ug | INTRAMUSCULAR | Status: DC | PRN
  Administered 2023-06-12: 25 ug via INTRAVENOUS

## 2023-06-12 MED ORDER — CLOPIDOGREL BISULFATE 75 MG PO TABS
75.0000 mg | ORAL_TABLET | Freq: Every day | ORAL | Status: DC
  Administered 2023-06-12 – 2023-06-17 (×6): 75 mg via ORAL
  Filled 2023-06-12 (×6): qty 1

## 2023-06-12 MED ORDER — ACETAMINOPHEN 650 MG RE SUPP: 325.0000 mg | RECTAL | Status: DC | PRN

## 2023-06-12 MED ORDER — POTASSIUM CHLORIDE CRYS ER 20 MEQ PO TBCR
20.0000 meq | EXTENDED_RELEASE_TABLET | Freq: Every day | ORAL | Status: DC | PRN
Start: 2023-06-12 — End: 2023-06-17

## 2023-06-12 MED ORDER — STERILE WATER FOR IRRIGATION IR SOLN
Status: DC | PRN
  Administered 2023-06-12: 1000 mL

## 2023-06-12 MED ORDER — MIDAZOLAM HCL 2 MG/2ML IJ SOLN
INTRAMUSCULAR | Status: DC | PRN
  Administered 2023-06-12: 2 mg via INTRAVENOUS

## 2023-06-12 MED ORDER — PHENYLEPHRINE 80 MCG/ML (10ML) SYRINGE FOR IV PUSH (FOR BLOOD PRESSURE SUPPORT)
PREFILLED_SYRINGE | INTRAVENOUS | Status: AC
  Filled 2023-06-12: qty 10

## 2023-06-12 MED ORDER — DEXAMETHASONE SODIUM PHOSPHATE 10 MG/ML IJ SOLN
INTRAMUSCULAR | Status: AC
  Filled 2023-06-12: qty 1

## 2023-06-12 MED ORDER — EPHEDRINE 5 MG/ML INJ
INTRAVENOUS | Status: AC
  Filled 2023-06-12: qty 5

## 2023-06-12 MED ORDER — ONDANSETRON HCL 4 MG/2ML IJ SOLN
4.0000 mg | Freq: Four times a day (QID) | INTRAMUSCULAR | Status: DC | PRN
  Administered 2023-06-12 – 2023-06-17 (×3): 4 mg via INTRAVENOUS
  Filled 2023-06-12 (×2): qty 2

## 2023-06-12 MED ORDER — ROCURONIUM BROMIDE 10 MG/ML (PF) SYRINGE
PREFILLED_SYRINGE | INTRAVENOUS | Status: AC
  Filled 2023-06-12: qty 10

## 2023-06-12 MED ORDER — NITROGLYCERIN IN D5W 200-5 MCG/ML-% IV SOLN
INTRAVENOUS | Status: AC
  Filled 2023-06-12: qty 250

## 2023-06-12 MED ORDER — GENTAMICIN SULFATE 40 MG/ML IJ SOLN
INTRAMUSCULAR | Status: DC | PRN
  Administered 2023-06-12: 80 mg via INTRAMUSCULAR

## 2023-06-12 MED ORDER — EPHEDRINE SULFATE-NACL 50-0.9 MG/10ML-% IV SOSY
PREFILLED_SYRINGE | INTRAVENOUS | Status: DC | PRN
  Administered 2023-06-12 (×3): 5 mg via INTRAVENOUS

## 2023-06-12 MED ORDER — NITROGLYCERIN 0.4 MG SL SUBL: 0.4000 mg | SUBLINGUAL_TABLET | SUBLINGUAL | Status: DC | PRN

## 2023-06-12 MED ORDER — OXYCODONE HCL 5 MG PO TABS: 5.0000 mg | ORAL_TABLET | Freq: Once | ORAL | Status: DC | PRN

## 2023-06-12 MED ORDER — GABAPENTIN 300 MG PO CAPS
300.0000 mg | ORAL_CAPSULE | Freq: Two times a day (BID) | ORAL | Status: DC
  Administered 2023-06-12 – 2023-06-17 (×11): 300 mg via ORAL
  Filled 2023-06-12 (×11): qty 1

## 2023-06-12 MED ORDER — SENNOSIDES-DOCUSATE SODIUM 8.6-50 MG PO TABS: 1.0000 | ORAL_TABLET | Freq: Every evening | ORAL | Status: DC | PRN

## 2023-06-12 MED ORDER — ETOMIDATE 2 MG/ML IV SOLN
INTRAVENOUS | Status: AC
  Filled 2023-06-12: qty 10

## 2023-06-12 MED ORDER — MIDAZOLAM HCL 2 MG/2ML IJ SOLN
INTRAMUSCULAR | Status: AC
  Filled 2023-06-12: qty 2

## 2023-06-12 MED ORDER — SORBITOL 70 % SOLN: 30.0000 mL | Freq: Every day | Status: DC | PRN

## 2023-06-12 MED ORDER — ONDANSETRON HCL 4 MG/2ML IJ SOLN
INTRAMUSCULAR | Status: AC
  Filled 2023-06-12: qty 2

## 2023-06-12 MED ORDER — VANCOMYCIN HCL 1000 MG IV SOLR
INTRAVENOUS | Status: AC
  Filled 2023-06-12: qty 20

## 2023-06-12 MED ORDER — FAMOTIDINE IN NACL 20-0.9 MG/50ML-% IV SOLN
20.0000 mg | Freq: Two times a day (BID) | INTRAVENOUS | Status: DC
  Administered 2023-06-12 – 2023-06-13 (×3): 20 mg via INTRAVENOUS
  Filled 2023-06-12 (×3): qty 50

## 2023-06-12 MED ORDER — DOCUSATE SODIUM 100 MG PO CAPS
100.0000 mg | ORAL_CAPSULE | Freq: Every day | ORAL | Status: DC
  Administered 2023-06-13 – 2023-06-17 (×4): 100 mg via ORAL
  Filled 2023-06-12 (×5): qty 1

## 2023-06-12 MED ORDER — ROCURONIUM BROMIDE 100 MG/10ML IV SOLN
INTRAVENOUS | Status: DC | PRN
  Administered 2023-06-12: 50 mg via INTRAVENOUS

## 2023-06-12 MED ORDER — HEPARIN SODIUM (PORCINE) 5000 UNIT/ML IJ SOLN
INTRAMUSCULAR | Status: AC
  Filled 2023-06-12: qty 1

## 2023-06-12 MED ORDER — ORAL CARE MOUTH RINSE: 15.0000 mL | Freq: Once | OROMUCOSAL | Status: AC

## 2023-06-12 MED ORDER — METOPROLOL SUCCINATE ER 25 MG PO TB24
25.0000 mg | ORAL_TABLET | Freq: Every day | ORAL | Status: DC
  Administered 2023-06-12 – 2023-06-15 (×4): 25 mg via ORAL
  Filled 2023-06-12 (×4): qty 1

## 2023-06-12 MED ORDER — LACTATED RINGERS IV SOLN: INTRAVENOUS | Status: DC | PRN

## 2023-06-12 MED ORDER — MAGNESIUM SULFATE 2 GM/50ML IV SOLN: 2.0000 g | Freq: Every day | INTRAVENOUS | Status: DC | PRN

## 2023-06-12 MED ORDER — NITROGLYCERIN IN D5W 200-5 MCG/ML-% IV SOLN: 5.0000 ug/min | INTRAVENOUS | Status: DC

## 2023-06-12 MED ORDER — LACTATED RINGERS IV SOLN: INTRAVENOUS | Status: DC

## 2023-06-12 MED ORDER — KETAMINE HCL 50 MG/5ML IJ SOSY
PREFILLED_SYRINGE | INTRAMUSCULAR | Status: DC | PRN
  Administered 2023-06-12: 20 mg via INTRAVENOUS

## 2023-06-12 MED ORDER — CEFAZOLIN SODIUM-DEXTROSE 2-4 GM/100ML-% IV SOLN
2.0000 g | Freq: Three times a day (TID) | INTRAVENOUS | Status: AC
  Administered 2023-06-12 (×2): 2 g via INTRAVENOUS
  Filled 2023-06-12 (×2): qty 100

## 2023-06-12 MED ORDER — ONDANSETRON HCL 4 MG/2ML IJ SOLN
4.0000 mg | Freq: Four times a day (QID) | INTRAMUSCULAR | Status: DC | PRN
  Filled 2023-06-12: qty 2

## 2023-06-12 MED ORDER — EPHEDRINE 5 MG/ML INJ
INTRAVENOUS | Status: AC
Start: 2023-06-12 — End: ?
  Filled 2023-06-12: qty 5

## 2023-06-12 MED ORDER — METOPROLOL TARTRATE 5 MG/5ML IV SOLN
2.0000 mg | INTRAVENOUS | Status: DC | PRN
Start: 2023-06-12 — End: 2023-06-17

## 2023-06-12 MED ORDER — PROTAMINE SULFATE 10 MG/ML IV SOLN
INTRAVENOUS | Status: AC
  Filled 2023-06-12: qty 25

## 2023-06-12 MED ORDER — ONDANSETRON HCL 4 MG/2ML IJ SOLN
INTRAMUSCULAR | Status: DC | PRN
Start: 2023-06-12 — End: 2023-06-12
  Administered 2023-06-12: 4 mg via INTRAVENOUS

## 2023-06-12 SURGICAL SUPPLY — 42 items
BAG DECANTER FOR FLEXI CONT (MISCELLANEOUS) ×2 IMPLANT
BLADE SURG 15 STRL LF DISP TIS (BLADE) ×2 IMPLANT
BLADE SURG SZ11 CARB STEEL (BLADE) ×2 IMPLANT
BRUSH SCRUB EZ 4% CHG (MISCELLANEOUS) ×2 IMPLANT
CHLORAPREP W/TINT 26 (MISCELLANEOUS) ×2 IMPLANT
CLAMP SUTURE YELLOW 5 PAIRS (MISCELLANEOUS) ×2 IMPLANT
CLEANSER WND VASHE 34 (WOUND CARE) ×2 IMPLANT
DRAPE INCISE IOBAN 66X45 STRL (DRAPES) ×2 IMPLANT
DRESSING SURGICEL FIBRLLR 1X2 (HEMOSTASIS) ×2 IMPLANT
DRSG OPSITE POSTOP 4X6 (GAUZE/BANDAGES/DRESSINGS) IMPLANT
ELECT CAUTERY BLADE 6.4 (BLADE) IMPLANT
ELECTRODE REM PT RTRN 9FT ADLT (ELECTROSURGICAL) ×2 IMPLANT
GLOVE BIO SURGEON STRL SZ7 (GLOVE) ×6 IMPLANT
GOWN STRL REUS W/ TWL LRG LVL3 (GOWN DISPOSABLE) ×4 IMPLANT
GOWN STRL REUS W/ TWL XL LVL3 (GOWN DISPOSABLE) ×2 IMPLANT
GOWN STRL REUS W/TWL 2XL LVL3 (GOWN DISPOSABLE) ×2 IMPLANT
GRAFT VASC PATCH XENOSURE 1X14 (Vascular Products) IMPLANT
IV NS 500ML BAXH (IV SOLUTION) ×2 IMPLANT
KIT STIMULAN RAPID CURE 5CC (Orthopedic Implant) IMPLANT
KIT TURNOVER KIT A (KITS) ×2 IMPLANT
LABEL OR SOLS (LABEL) ×2 IMPLANT
LOOP VESSEL MAXI 1X406 RED (MISCELLANEOUS) ×4 IMPLANT
LOOP VESSEL MINI 0.8X406 BLUE (MISCELLANEOUS) ×4 IMPLANT
MANIFOLD NEPTUNE II (INSTRUMENTS) ×2 IMPLANT
NS IRRIG 500ML POUR BTL (IV SOLUTION) ×2 IMPLANT
PACK BASIN MAJOR ARMC (MISCELLANEOUS) ×2 IMPLANT
PACK UNIVERSAL (MISCELLANEOUS) ×2 IMPLANT
SET WALTER ACTIVATION W/DRAPE (SET/KITS/TRAYS/PACK) ×2 IMPLANT
SPONGE T-LAP 18X18 ~~LOC~~+RFID (SPONGE) IMPLANT
STAPLER SKIN PROX 35W (STAPLE) ×2 IMPLANT
SUT PROLENE 5 0 RB 1 DA (SUTURE) ×4 IMPLANT
SUT PROLENE 6 0 BV (SUTURE) ×8 IMPLANT
SUT PROLENE 7 0 BV 1 (SUTURE) ×4 IMPLANT
SUT SILK 2-0 18XBRD TIE 12 (SUTURE) ×2 IMPLANT
SUT SILK 3-0 18XBRD TIE 12 (SUTURE) ×2 IMPLANT
SUT VIC AB 2-0 CT1 TAPERPNT 27 (SUTURE) ×4 IMPLANT
SUT VIC AB 3-0 SH 27X BRD (SUTURE) ×2 IMPLANT
SUT VICRYL+ 3-0 36IN CT-1 (SUTURE) ×4 IMPLANT
SYR 5ML LL (SYRINGE) ×2 IMPLANT
TRAP FLUID SMOKE EVACUATOR (MISCELLANEOUS) ×2 IMPLANT
TRAY FOLEY SLVR 16FR LF STAT (SET/KITS/TRAYS/PACK) IMPLANT
WATER STERILE IRR 500ML POUR (IV SOLUTION) ×2 IMPLANT

## 2023-06-12 NOTE — Anesthesia Postprocedure Evaluation (Signed)
 Anesthesia Post Note  Patient: Brianna Roth  Procedure(s) Performed: ENDARTERECTOMY, FEMORAL (Left) APPLICATION OF CELL SAVER  Patient location during evaluation: PACU Anesthesia Type: General Level of consciousness: awake and alert Pain management: pain level controlled Vital Signs Assessment: post-procedure vital signs reviewed and stable Respiratory status: spontaneous breathing, nonlabored ventilation, respiratory function stable and patient connected to nasal cannula oxygen Cardiovascular status: blood pressure returned to baseline and stable Postop Assessment: no apparent nausea or vomiting Anesthetic complications: no  No notable events documented.   Last Vitals:  Vitals:   06/12/23 1100 06/12/23 1115  BP: 125/66   Pulse: 75 74  Resp: 17 16  Temp:    SpO2: 94% 96%    Last Pain:  Vitals:   06/12/23 1059  TempSrc:   PainSc: 5                  Enrique Harvest

## 2023-06-12 NOTE — Op Note (Signed)
 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  ASSISTANT(S): None  ANESTHESIA: general  ESTIMATED BLOOD LOSS: 50 cc  FINDING(S): Profound calcific plaque noted of the left common femoral extending past the initial bifurcation of the profunda femoris arteries as well as down the extensive length of the SFA  SPECIMEN(S):  Calcific plaque from the common femoral, superficial femoral and the profunda femoris artery  INDICATIONS:   Brianna Roth 63 y.o. y.o.female who presents with complaints of lifestyle limiting claudication and pain continuously in the left lower extremity. The patient has documented severe atherosclerotic occlusive disease and has undergone minimally invasive treatments in the past. However, at this point his primary area of stricture stenosis resides in the common femoral and origins of the superficial femoral and profunda femoris extending into these arteries and therefore this is not amenable to intervention. The patient is therefore undergoing open endarterectomy. The risks and benefits of surgery have been reviewed with the patient, all questions have answered; alternative therapies have been reviewed as well and the patient has agreed to proceed with surgical open repair.  DESCRIPTION: After obtaining full informed written consent, the patient was brought back to the operating room and placed supine upon the operating table.  The patient received IV antibiotics prior to induction.  After obtaining adequate anesthesia, the patient was prepped and draped in the standard fashion for left  femoral exposure.  Co-surgeons are required because this is a complicated procedure with work being performed simultaneously by both surgeons.  This expedites the procedure making a shorter operative time reducing complications and improving patient safety.  Attention was turned to the left groin with Dr. Vonna Guardian working on the patient's left and myself working on the right of the patient.  Vertical  Incision was made over the left common femoral artery and dissection carried down to the common femoral artery with electrocautery.  I dissected out the common femoral artery from the distal external iliac artery (identified by the superficial circumflex vessels) down to the femoral bifurcation.  On initial inspection, the common femoral artery was: densely calcified and there was no palpable pulse noted.    Subsequently the dissection was continued to include all circumflex branches and the profunda femoral artery and superficial femoral artery. The superficial femoral artery was dissected circumferentially for a distance of approximately 3-4 cm and the profunda femoris was dissected circumferentially out to the fourth order branches individual vessel loops were placed around each branch.  Control of all branches was obtained with vessel loops.  A softer area in the distal external iliac artery amendable to clamping was identified.    The patient was given 5000 units of Heparin  intravenously, which was a therapeutic bolus.   After waiting 3 minutes, the distal external iliac artery was clamped and all of the vessel loops were placed under tension.  Arteriotomy was made in the common femoral artery with a 11-blade and extended it with a Potts scissor proximally and distally extending the distal end down the SFA for approximately 3 cm.   Endarterectomy was then performed under direct visualization using a freer elevator and a right angle from the mid common femoral extending up both proximally and distally. Proximally  the endarterectomy was brought up  to the level of the clamp where a clean edge was obtained. Distally the endarterectomy was carried down to a soft spot in the SFA where a feathered edge would was obtained.  7-0 Prolene interrupted tacking sutures were placed to secure the leading edge of the plaque in the SFA.  The profunda femoris was treated with an eversion technique extending endarterectomy approximately 3-4 cm distally again obtaining a featheredge on left side.   At this point, a bovine pericardial patch was fashioned for the geometry of the arteriotomy.  The pericardial patch was sewn to the artery with 2 running stitches of 6-0 Prolene, running from each end.  Prior to completing the patch angioplasty, the profunda femoral artery was flushed as was the superficial femoral artery. The system was then forward flushed. The endarterectomy site was then irrigated copiously with heparinized saline. The patch angioplasty was completed in the usual fashion.  Flow was then reestablished first to the profunda femoris and then the superficial femoral artery. Any gaps or bleeding sites in the suture line were easily controlled with a 6-0 Prolene suture.  Easily palpable pulses were noted in the SFA and profunda distal to the reconstruction.  The left groin was then irrigated copiously with sterile saline and subsequently Vistaseal and fibrillar were placed in the wound.  Additionally, antibiotic beads using vancomycin and gentamicin were also placed in the bed of the wounds.  The incision was repaired with a double layer of 2-0 Vicryl, a double layer of 3-0 Vicryl, and staples were used to approximate the skin.  Honeycomb dressing was then applied to the left groin.   COMPLICATIONS: None  CONDITION: Brianna Roth, M.D. [facility] Office: [phone]  06/12/2023, 5:41 PM

## 2023-06-12 NOTE — Anesthesia Procedure Notes (Signed)
 Arterial Line Insertion Start/End4/30/2025 7:40 AM, 06/12/2023 7:45 AM Performed by: Enrique Harvest, MD, Aniceto Kern, RN, CRNA  Patient location: Pre-op. Preanesthetic checklist: patient identified, IV checked, site marked, risks and benefits discussed, surgical consent, monitors and equipment checked, pre-op evaluation, timeout performed and anesthesia consent Patient sedated Left, radial was placed Catheter size: 20 G Hand hygiene performed , maximum sterile barriers used  and Seldinger technique used  Attempts: 1 Procedure performed using ultrasound guided technique. Ultrasound Notes:anatomy identified, needle tip was noted to be adjacent to the nerve/plexus identified and no ultrasound evidence of intravascular and/or intraneural injection Following insertion, dressing applied and Biopatch. Post procedure assessment: normal and unchanged  Patient tolerated the procedure well with no immediate complications.

## 2023-06-12 NOTE — Interval H&P Note (Signed)
 History and Physical Interval Note:  06/12/2023 7:24 AM  Brianna Roth  has presented today for surgery, with the diagnosis of ASO WITH REST PAIN.  The various methods of treatment have been discussed with the patient and family. After consideration of risks, benefits and other options for treatment, the patient has consented to  Procedure(s): ENDARTERECTOMY, FEMORAL (Left) APPLICATION OF CELL SAVER (N/A) as a surgical intervention.  The patient's history has been reviewed, patient examined, no change in status, stable for surgery.  I have reviewed the patient's chart and labs.  Questions were answered to the patient's satisfaction.     Lundon Rosier

## 2023-06-12 NOTE — Op Note (Signed)
 OPERATIVE NOTE   PROCEDURE: 1.   Left common femoral, profunda femoris, and superficial femoral artery endarterectomies and patch angioplasty    PRE-OPERATIVE DIAGNOSIS: 1.Atherosclerotic occlusive disease left lower extremities with disabling claudication and early rest pain symptoms   POST-OPERATIVE DIAGNOSIS: Same  SURGEON: Mikki Alexander, MD  CO-surgeon: Devon Fogo, MD  ANESTHESIA:  general  ESTIMATED BLOOD LOSS: 50 cc  FINDING(S): 1.  significant plaque in left common femoral, profunda femoris, and superficial femoral arteries  SPECIMEN(S):  Left common femoral, profunda femoris, and superficial femoral artery plaque.  INDICATIONS:    Patient presents with severe left leg pain that is now waking her from sleep and keeping her from walking even short distances and severe femoral bifurcation disease seen on angiography.  Left femoral endarterectomy is planned to try to improve perfusion.  The risks and benefits as well as alternative therapies including intervention were reviewed in detail all questions were answered the patient agrees to proceed with surgery.  DESCRIPTION: After obtaining full informed written consent, the patient was brought back to the operating room and placed supine upon the operating table.  The patient received IV antibiotics prior to induction.  After obtaining adequate anesthesia, the patient was prepped and draped in the standard fashion appropriate time out is called.    Vertical incision was created overlying the left femoral arteries. The common femoral artery proximally, and superficial femoral artery, and primary profunda femoris artery branches were encircled with vessel loops and prepared for control. The left femoral arteries were found to have significant plaque from the common femoral artery into the profunda and superficial femoral arteries.   5000 units of heparin  was given and allowed circulate for 5 minutes.   Attention is then turned  to the left femoral artery.  An arteriotomy is made with 11 blade and extended with Potts scissors in the common femoral artery and carried down onto the first 2-3 cm of the superficial femoral artery. An endarterectomy was then performed. The [facility] was used to create a plane. The proximal endpoint was cut flush with tenotomy scissors. This was in the proximal common femoral artery. An extensive eversion endarterectomy was then performed for the first 3-4 cm of the profunda femoris artery.  This was done with gentle traction and the freer elevator and a nice feathered distal endpoint was seen from several centimeters down to the profunda femoris artery bifurcation. Good backbleeding was then seen. The distal endpoint of the superficial femoral endarterectomy was created with gentle traction and the distal endpoint was tacked down with a pair of 7-0 Prolene sutures.  The bovine pericardial patcth is then selected and prepared for a patch angioplasty.  It is cut and beveled and started at the proximal endpoint with a 6-0 Prolene suture.  Approximately one half of the suture line is run medially and laterally and the distal end point was cut and bevelled to match the arteriotomy.  A second 6-0 Prolene was started at the distal end point and run to the mid portion to complete the arteriotomy.  The vessel was flushed prior to release of control and completion of the anastomosis.  At this point, flow was established first to the profunda femoris artery and then to the superficial femoral artery. Easily palpable pulses are noted well beyond the anastomosis and both arteries.  We then irrigated with Vashe irrigation. Fibrillar and Vistacel topical hemostatic agents were placed in the femoral incision and hemostasis was complete.  Vancomycin and gentamicin impregnated antibiotic beads  were then placed into the wound.  The femoral incision was then closed in a layered fashion with 2 layers of 2-0 Vicryl, 2  layers of 3-0 Vicryl, and staples for the skin closure. Sterile dressing were then placed over the incision.  The patient was then awakened from anesthesia and taken to the recovery room in stable condition having tolerated the procedure well.  COMPLICATIONS: None  CONDITION: Stable     Mikki Alexander 06/12/2023 9:51 AM   This note was created with Dragon Medical transcription system. Any errors in dictation are purely unintentional.

## 2023-06-12 NOTE — Transfer of Care (Signed)
 Immediate Anesthesia Transfer of Care Note  Patient: Brianna Roth  Procedure(s) Performed: ENDARTERECTOMY, FEMORAL (Left) APPLICATION OF CELL SAVER  Patient Location: PACU  Anesthesia Type:General  Level of Consciousness: awake and patient cooperative  Airway & Oxygen Therapy: Patient Spontanous Breathing and Patient connected to face mask oxygen  Post-op Assessment: Report given to RN and Post -op Vital signs reviewed and stable  Post vital signs: Reviewed and stable  Last Vitals:  Vitals Value Taken Time  BP 120/72 06/12/23 1003  Temp    Pulse 82 06/12/23 1006  Resp 14 06/12/23 1006  SpO2 100 % 06/12/23 1006  Vitals shown include unfiled device data.  Last Pain:  Vitals:   06/12/23 0624  TempSrc: Temporal  PainSc: 0-No pain         Complications: No notable events documented.

## 2023-06-12 NOTE — Anesthesia Preprocedure Evaluation (Signed)
 Anesthesia Evaluation  Patient identified by MRN, date of birth, ID band Patient awake    Reviewed: Allergy & Precautions, NPO status , Patient's Chart, lab work & pertinent test results  Airway Mallampati: II  TM Distance: >3 FB Neck ROM: full    Dental  (+) Dental Advidsory Given, Missing, Poor Dentition   Pulmonary asthma , Current Smoker and Patient abstained from smoking.   Pulmonary exam normal        Cardiovascular hypertension, On Medications and On Home Beta Blockers + angina  + CAD, + Past MI, + Cardiac Stents, + Peripheral Vascular Disease and +CHF  Normal cardiovascular exam     Neuro/Psych  PSYCHIATRIC DISORDERS Anxiety Depression    CVA, No Residual Symptoms    GI/Hepatic negative GI ROS, Neg liver ROS,,,  Endo/Other  negative endocrine ROS    Renal/GU      Musculoskeletal   Abdominal   Peds  Hematology negative hematology ROS (+)   Anesthesia Other Findings Patient has a history of several heart attacks, with her last being in 2021. Her latest echo shows an EF of 25-30%. Patient also has a history of strokes but denies any residual symptoms from it. Discussed the patients increased risk of suffering another event like heart attack or stroke during surgery while under anesthesia. Patient began crying. Discussed whether or not she wanted to proceed but she stated she wanted to "get this over with" because "Dr. Vonna Guardian will take good care of me." Explained the use of an arterial line to closely monitor her blood pressure and the patient agreed to it. Patient stated she understood and agreed to the increased risk of complications under anesthesia.    Note from Renate Caroline:  Brianna Roth is a 63 y.o. female who is submitted for pre-surgical anesthesia review and clearance prior to her undergoing the above procedure. Patient is a Current Smoker (6.3 pack years). 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   Patient is followed by cardiology Beau Bound, MD). She was last seen in the cardiology clinic on 02/25/2023; notes reviewed. At the time of her clinic visit, patient doing well overall from a cardiovascular perspective. Patient denied any chest pain, shortness of breath, PND, orthopnea, palpitations, significant peripheral edema, weakness, fatigue, vertiginous symptoms, or presyncope/syncope. Patient with a past medical history significant for cardiovascular diagnoses. Documented physical exam was grossly benign, providing no evidence of acute exacerbation and/or decompensation of the patient's known cardiovascular conditions.    Patient suffered an anterolateral STEMI on 06/09/2008.  She initially presented for treatment here at [facility]. Due to concurrent altered mental status concerning for CVA, intervention was delayed.      MRI imaging of the brain was performed on 06/09/2008 revealing areas of encephalomalacia involving the RIGHT frontal operculum and high RIGHT frontoparietal region consistent with sequela of remote infarction.    Patient was transferred to [facility] on 06/10/2008 where she underwent diagnostic LEFT heart catheterization revealing multivessel CAD; 100% mid LAD, 50% mid RCA, 10% ostial LM, 30% ramus intermedius, and 90% D2.  Patient with severe post infarct ischemic cardiomyopathy with EF of 25%.  There was anterior wall akinesis noted.. She subsequently underwent mechanical thrombectomy and PCI placing a 2.75 x 24 mm micro Driver BMS to the mid LAD lesion.  Procedure yielded excellent  angiographic result and TIMI-3 flow.    Following anterolateral STEMI, patient underwent cardiovascular MRI on 06/14/2008  demonstrating a akinetic mid ventricular anterior, anteroseptal, and apex consistent with known LAD perfusion territory infarct.  Global systolic function significantly reduced with a visual LVEF of 30-35%.  Right ventricular size and function normal.  Atria were unremarkable.  There were no significant valvular abnormalities.  Delayed enhancement for viability was abnormal.  There was a large myocardial infarction in the mid anterior and anteroseptal location from the mid ventricular level and encompassing the entire apex.  Infarct was nearly transmural throughout.  Several intracavitary thrombi ranging from 3 to 10 mm in diameter were seen in the left ventricular apex.    Patient suffered an inferior STEMI on 06/19/2013.  Patient was transferred to [facility] where she underwent diagnostic LEFT heart catheterization on 06/20/2013 revealing multivessel CAD; 100% lateral ramus intermedius, 50% mid RCA, and 99% distal RCA.  PCI was performed placing an Xience Alpine DES x 1 to the distal RCA.  Procedure yielded excellent angiographic result and TIMI-3 flow.    Patient suffered a second inferior STEMI on 03/16/2018.  She underwent diagnostic LEFT heart catheterization revealing multivessel CAD 70% mid RCA, 100% distal RCA, 75% ostial RPDA, and 30% proximal to mid LAD.  PCI was subsequently performed placing a 2.75 x 15 mm Resolute Onyx DES x 1 to the mid RCA and overlapping 2.25 x 16 mm and 2.25 x 18 mm Resolute Onyx stents into the distal RCA.    Again, patient with post infarct ischemic cardiomyopathy. Most recent TTE performed on 05/04/2019 revealed a normal left ventricular systolic function with an EF of 30-35%.  The were wall motion abnormalities:  apical lateral segment, apical anterior segment, and apex are akinetic. Additionally, the basal anteroseptal segment, basal anterolateral segment, and basal anterior segment are hypokinetic.  Left ventricular internal cavity size  moderately dilated.  Left ventricular diastolic Doppler parameters were normal.  There was mild biatrial enlargement.  Right ventricle was mildly enlarged with low normal systolic function; TAPSE = 3.1 cm (normal range >/= 1.6 cm).  PASP/RVSP was normal.  There was mild mitral and tricuspid valve regurgitation. All transvalvular gradients were noted to be normal providing no evidence suggestive of valvular stenosis. Aorta normal in size with appreciable ectasia or aneurysmal dilatation.    Most recent myocardial perfusion imaging study was performed on 05/04/2019 severely reduced left ventricular systolic function with an EF of 25%.  There was basal anterior, basal inferolateral, and basal anterolateral, mid anterior, mid anterolateral, apical anterior, apical lateral, and apex akinesis.  No artifact or left ventricular cavity size enlargement appreciated on review of imaging. SPECT images demonstrated no evidence of stress-induced myocardial ischemia or arrhythmia; no scintigraphic evidence of scar.  TID ratio = 1.13.  Baseline heart rate 50 bpm, which increased to 120 bpm with stress; 74% MPHR.  Patient demonstrated a mild hypertensive response to exercise increasing from a systolic pressure of 135 to 152 mmHg.  Patient able to achieve 1 METS of physical activity during the study.  Overall, study determined to be high risk.    Patient underwent lower extremity angiography study on 05/13/2023 revealing >80% stenosis of the RIGHT common iliac artery.  Additionally, there was 95% stenosis of the LEFT common femoral artery and <30% stenosis of the LEFT superficial femoral and popliteal arteries.  Given the degree of stenosis in the left lower extremity, patient will require revascularization.   Given the presence of an  older generation bare-metal stent, in conjunction with patient's significant PVD diagnosis, patient remains on daily DAPT therapy using ASA + clopidogrel .  Patient reportedly compliant with  therapy with no evidence or reports of GI/GU related bleeding.  Blood pressure well-controlled at 136/84 mmHg on currently prescribed ACEi (lisinopril ), diuretic (spironolactone ), and beta-blocker (metoprolol  succinate) therapies.  Patient has a supply of short acting nitrates (NTG) to use on a as needed basis for recurrent angina/anginal equivalent symptoms; denied recent use.  Patient is on atorvastatin  for HLD diagnosis and further ASCVD prevention.  Patient is not diabetic.  She does not have an OSAH diagnosis. Patient is able to complete all of her  ADL/IADLs without cardiovascular limitation.  Per the DASI, patient is able to achieve at least 4 METS of physical activity without experiencing any significant degree of angina/anginal equivalent symptoms. No changes were made to her medication regimen during her visit with cardiology.  Patient scheduled to follow-up with outpatient cardiology in 6 months or sooner if needed.   Anisten Gulyas Frink is scheduled for an elective ENDARTERECTOMY, FEMORAL (Left) on 06/12/2023 with Dr. Mikki Alexander, MD.  Given patient's past medical history significant for cardiovascular diagnoses, presurgical cardiac clearance was sought by the PAT team. Per cardiology, "this patient is optimized for surgery and may proceed with the planned procedural course with a MODERATE risk of significant perioperative cardiovascular complications".   Again, this patient is on daily oral antithrombotic therapy using low-dose ASA and clopidogrel .  Per directives from vascular surgery, patient will hold his daily clopidogrel  dose for 5 days prior to his procedure with plans to restart as soon as postoperative bleeding risk felt to be minimized by his primary attending surgeon.  Patient is aware that his last dose of clopidogrel  should be on 06/06/2023. Given that patient's past medical history is significant for cardiovascular diagnoses, including but not limited to CAD, vascular surgery has cleared  patient to continue her daily low dose ASA throughout her perioperative course. She will be asked to hold her normal dose on the day of her procedure only. Patient has been updated on these directives from her specialty care providers by the PAT team.   Past Medical History: No date: Anginal pain (HCC) No date: Anxiety No date: Aortic atherosclerosis (HCC) No date: Asthma No date: Cancer (HCC) 06/09/2008: Coronary artery disease     Comment:  a.) antlat STEMI 06/09/2008 -> LHC/PCI 06/10/2008: 100               mLAD (thrombectomy + 2.75 x 24mm Micro-driver BMS), 50               mRCA, 10 oLM, 30 RI, 90 D2; b.) inf STEMI 06/19/2013 -               LHC/PCI 06/20/2013: 100 lat RI, 50 mRCA, 99 dRCA (Xience               Alpine DES); c.) inf STEMI 03/16/2018 -> LHC/PCI: 70 mRCA              (2.75 x 15mm Resolute Onyx DES), 100 dRCA (overlapping               2.25 x 16mm + 2.25 x 18mm Resolute Onyx DES), 75 oRPDA,               30 p-mLAD No date: Depression No date: Dyspnea No date: Febrile seizures (during childhood) 05/2008: HFrEF (heart failure with reduced ejection fraction) (HCC)  Comment:  a.) due to post-infarct ischemia cardiomyopathy; EF by               LHC 25% on 06/10/2008 with anterior wall akinesis No date: History of heart artery stent     Comment:  a.) TOTAL OF: 5 stents (LAD, m-dRCA) as of 06/11/2023 No date: Hyperlipidemia No date: Hypertension 05/2008: Ischemic cardiomyopathy     Comment:  a.) post-anterolateral infarct; EF 25% via LHC with               anterior wall akinesis No date: Long term current use of clopidogrel  No date: Long-term use of aspirin  therapy 06/10/2008: LV (left ventricular) mural thrombus following MI (HCC) No date: PVD (peripheral vascular disease) (HCC) 06/09/2008: ST elevation myocardial infarction (STEMI) of  anterolateral wall (HCC)     Comment:  a.) anterolateral STEMI 06/09/2008 --> delayed               intervention at [facility] d/t  mental status changes and               concerns for CVA; MRI showed old CVAs; b.) LCH/PCI               06/10/2008: 100% mLAD (mechanical thrombectomy + 2.75 x               24 mm Micro-driver BMS), 16% mRCA, 10% oLM, 30% RI, 90%               D2 03/16/2018: ST elevation myocardial infarction (STEMI) of inferior  wall (HCC)     Comment:  a.) LHC/PCI 03/16/2018: 70% mRCA (2.75 x 15 mm Resolute               Onyx DES), 100% dRCA (overlapping 2.25 x 16 mm and 2.25 x              18 mm Resolute Oxyx DES), 75% oRPDA, 30% p-mLAD 06/19/2013: ST elevation myocardial infarction (STEMI) of inferior  wall (HCC)     Comment:  a.) LHC/PCI 06/20/2013: 99% dRCA (Xience Alpine DES) No date: Stroke [facility])     Comment:  a.) brain MRI 06/09/2008: areas of encephalomalacia               involving the RIGHT frontal operculum and high RIGHT               frontoparietal region c/w sequela of remote infarction No date: Tobacco abuse  Past Surgical History: No date: ABDOMINAL HYSTERECTOMY No date: BACK SURGERY No date: CORONARY ANGIOPLASTY 06/10/2008: CORONARY ANGIOPLASTY WITH STENT PLACEMENT; Left     Comment:  Procedure: CORONARY ANGIOPLASTY WITH STENT PLACEMENT;               Location: [facility] 06/20/2013: CORONARY ANGIOPLASTY WITH STENT PLACEMENT; Left     Comment:  Procedure: CORONARY ANGIOPLASTY WITH STENT PLACEMENT;               Location: [facility] 03/16/2018: CORONARY/GRAFT ACUTE MI REVASCULARIZATION; N/A     Comment:  Procedure: Coronary/Graft Acute MI Revascularization;                Surgeon: Percival Brace, MD;  Location: [facility]               INVASIVE CV LAB;  Service: Cardiovascular;  Laterality:               N/A; 03/16/2018: LEFT HEART CATH AND CORONARY ANGIOGRAPHY; N/A     Comment:  Procedure: LEFT HEART  CATH AND CORONARY ANGIOGRAPHY;                Surgeon: Percival Brace, MD;  Location: [facility]               INVASIVE CV LAB;  Service: Cardiovascular;  Laterality:                N/A; 05/13/2023: LOWER EXTREMITY ANGIOGRAPHY; Left     Comment:  Procedure: Lower Extremity Angiography;  Surgeon: Celso College, MD;  Location: [facility] INVASIVE CV LAB;  Service:               Cardiovascular;  Laterality: Left; No date: THROAT SURGERY No date: THYROIDECTOMY  BMI    Body Mass Index: 22.38 kg/m      Reproductive/Obstetrics negative OB ROS                             Anesthesia Physical Anesthesia Plan  ASA: 3  Anesthesia Plan: General ETT   Post-op Pain Management:    Induction: Intravenous  PONV Risk Score and Plan: 3 and Ondansetron , Dexamethasone and Midazolam   Airway Management Planned: Oral ETT  Additional Equipment: Arterial line  Intra-op Plan:   Post-operative Plan: Extubation in OR  Informed Consent: I have reviewed the patients History and Physical, chart, labs and discussed the procedure including the risks, benefits and alternatives for the proposed anesthesia with the patient or authorized representative who has indicated his/her understanding and acceptance.     Dental Advisory Given  Plan Discussed with: Anesthesiologist, CRNA and Surgeon  Anesthesia Plan Comments: (Patient consented for risks of anesthesia including but not limited to:  - adverse reactions to medications - damage to eyes, teeth, lips or other oral mucosa - nerve damage due to positioning  - sore throat or hoarseness - Damage to heart, brain, nerves, lungs, other parts of body or loss of life - Patient at an elevated risk for heart attack and stroke given her history.  Patient voiced understanding and assent.)       Anesthesia Quick Evaluation

## 2023-06-12 NOTE — Plan of Care (Signed)

## 2023-06-12 NOTE — Evaluation (Signed)
 Occupational Therapy Evaluation Patient Details Name: Brianna Roth MRN: [id number] DOB: [birth year] Today's Date: 06/12/2023   History of Present Illness   Pt is a 63 year old female presents for planned surgery is now s/p common femoral, profunda femoris, and superficial femoral artery endarterectomies and patch angioplasty. PMH: asthma, HTN, CAD, PVD, CHF, anxiety, depression, CVA, several heart attacks.     Clinical Impressions Pt was seen for PT/OT co-evaluation this date to maximize pt/therapist safety. PTA, pt reports living with one of her daughters in a 1 level home with steps to enter. She reports being IND without AD use, however going up/down steps had become difficulty d/t leg pain. She states she will be moving in with her other daughter into a level entry 1 level home on DC, but has not yet notified her daughter of this. Unsure of pt's current alertness/orientation as she may still be weaning off anesthesia.  Pt presents to acute OT demonstrating impaired ADL performance and functional mobility 2/2 weakness, pain, low activity tolerance and balance deficits. Does c/o mild/mod pain to incision site. Pt currently requires CGA x2 for lines/leads management and following LUE precautions d/t A-line placement. Constant hands on assist to maintain NWB during bed mobility. Mod A x2 for scooting to HOB. Pt tolerated sitting EOB x2-3 mins with total assist for donning socks. Chair set up, then pt stating she did not feel well and needed to return to supine. Vitals remained stable throughout session.  Pt would benefit from skilled OT services to address noted impairments and functional limitations to maximize safety and independence while minimizing falls risk and caregiver burden. Do anticipate the need for follow up OT services upon acute hospital DC.      If plan is discharge home, recommend the following:   A lot of help with bathing/dressing/bathroom;A lot of help with walking  and/or transfers;Help with stairs or ramp for entrance;Assistance with cooking/housework     Functional Status Assessment   Patient has had a recent decline in their functional status and demonstrates the ability to make significant improvements in function in a reasonable and predictable amount of time.     Equipment Recommendations   BSC/3in1;Other (comment) (defer)     Recommendations for Other Services         Precautions/Restrictions   Precautions Precautions: Fall Recall of Precautions/Restrictions: Impaired Restrictions Other Position/Activity Restrictions: no official weight bearing orders-treated L radial A-line as NWB during session     Mobility Bed Mobility Overal bed mobility: Needs Assistance Bed Mobility: Supine to Sit, Sit to Supine     Supine to sit: Contact guard, +2 for safety/equipment Sit to supine: Contact guard assist, +2 for physical assistance   General bed mobility comments: +2 for lines/leads management, pt's impulsivity and inability to maintain NWB through LUE where A-line is; needed hand over hand/constant cueing; declined chair transfer d/t not feeling well after sitting up although vitals were stable    Transfers                   General transfer comment: deferred d/t pt not feeling well and wishing to lay back down      Balance Overall balance assessment: Needs assistance Sitting-balance support: Feet supported, Single extremity supported Sitting balance-Leahy Scale: Fair                                     ADL either performed  or assessed with clinical judgement   ADL Overall ADL's : Needs assistance/impaired                     Lower Body Dressing: Total assistance;Sitting/lateral leans Lower Body Dressing Details (indicate cue type and reason): to don bil socks d/t a line in L UE                     Vision         Perception         Praxis         Pertinent  Vitals/Pain Pain Assessment Pain Assessment: Faces Faces Pain Scale: Hurts little more Pain Location: L groin incision site Pain Descriptors / Indicators: Operative site guarding Pain Intervention(s): Monitored during session, Repositioned     Extremity/Trunk Assessment Upper Extremity Assessment Upper Extremity Assessment: Difficult to assess due to impaired cognition   Lower Extremity Assessment Lower Extremity Assessment: Generalized weakness;LLE deficits/detail LLE Deficits / Details: sore s/p endarterectomies       Communication Communication Communication: No apparent difficulties   Cognition Arousal: Alert Behavior During Therapy: Impulsive Cognition: Cognition impaired             OT - Cognition Comments: seemingly still coming off anesthesia; stated name and knew she was in the hospital for her leg, thought it was May then corrected to June 12, 2023; constant cueing and hand over hand to prevent weight bearing throughout LUE-A line                 Following commands: Impaired Following commands impaired: Follows one step commands inconsistently     Cueing  General Comments   Cueing Techniques: Verbal cues;Gestural cues;Tactile cues  VSS with session; dressing intact to groin incision as well as IV and A-line intact pre/post session   Exercises Other Exercises Other Exercises: Edu on role of OT in acute setting.   Shoulder Instructions      Home Living Family/patient expects to be discharged to:: Private residence Living Arrangements: Children Available Help at Discharge: Family;Available PRN/intermittently Type of Home: House Home Access: Level entry     Home Layout: One level     Bathroom Shower/Tub: Walk-in shower         Home Equipment: None   Additional Comments: pt loopy/still coming off anesthesia and unable to provide full history; reports she lives with dtr [name], but will be moving in with other dtr [name] on DC, but has  to call to let [name] know that?      Prior Functioning/Environment Prior Level of Function : Independent/Modified Independent             Mobility Comments: reports IND without AD use; reports stairs were difficult d/t leg pain ADLs Comments: IND    OT Problem List: Decreased strength;Decreased activity tolerance;Impaired balance (sitting and/or standing);Decreased safety awareness;Pain   OT Treatment/Interventions: Self-care/ADL training;Balance training;Therapeutic exercise;Therapeutic activities;Patient/family education;DME and/or AE instruction      OT Goals(Current goals can be found in the care plan section)   Acute Rehab OT Goals Patient Stated Goal: improve function OT Goal Formulation: With patient Time For Goal Achievement: 06/26/23 Potential to Achieve Goals: Fair ADL Goals Pt Will Perform Grooming: with set-up;sitting Pt Will Perform Lower Body Dressing: with contact guard assist;sit to/from stand;sitting/lateral leans Pt Will Transfer to Toilet: with contact guard assist;bedside commode;regular height toilet;ambulating   OT Frequency:  Min 2X/week    Co-evaluation  AM-PAC OT "6 Clicks" Daily Activity     Outcome Measure Help from another person eating meals?: None Help from another person taking care of personal grooming?: A Little Help from another person toileting, which includes using toliet, bedpan, or urinal?: A Lot Help from another person bathing (including washing, rinsing, drying)?: A Lot Help from another person to put on and taking off regular upper body clothing?: A Little Help from another person to put on and taking off regular lower body clothing?: A Lot 6 Click Score: 16   End of Session Equipment Utilized During Treatment: Oxygen Nurse Communication: Mobility status  Activity Tolerance: Patient tolerated treatment well (limited by not feeling well--still coming off anesthesia?) Patient left: in bed;with call  bell/phone within reach;with bed alarm set  OT Visit Diagnosis: Other abnormalities of gait and mobility (R26.89);Muscle weakness (generalized) (M62.81);Pain Pain - Right/Left: Left Pain - part of body: Hip;Leg                Time: 1610-9604 OT Time Calculation (min): 24 min Charges:  OT General Charges $OT Visit: 1 Visit OT Evaluation $OT Eval Moderate Complexity: 1 Mod Retha Bither, OTR/L 06/12/23, 3:22 PM  Aziah Kaiser E Agnes Probert 06/12/2023, 3:18 PM

## 2023-06-12 NOTE — Evaluation (Signed)
 Physical Therapy Evaluation Patient Details Name: Brianna Roth MRN: [id number] DOB: [birth year] Today's Date: 06/12/2023  History of Present Illness  Pt is a 63 year old female presents for planned surgery is now s/p common femoral, profunda femoris, and superficial femoral artery endarterectomies and patch angioplasty. PMH: asthma, HTN, CAD, PVD, CHF, anxiety, depression, CVA, several heart attacks.   Clinical Impression  Patient alert, but still groggy from anesthesia, oriented x4 but questionable situational awareness notable through conversation. Per pt she was independent at baseline and lives with her daughter, but plans to switch to living with her other daughter, though stated she needs to still call her to tell her that. PT/OT co-eval for +2 for lines/leads management, pt's impulsivity and inability to maintain NWB through LUE where A-line is located. Pt needed hand over hand/constant cueing for all tasks. CGAx2 for bed mobility, with maxA to maintain NWB on LUE. Pt declined further mobility/transfers due not feeling well though vitals stable, returned to supine.  Overall the patient demonstrated deficits (see "PT Problem List") that impede the patient's functional abilities, safety, and mobility and would benefit from skilled PT intervention.        If plan is discharge home, recommend the following: A little help with walking and/or transfers;A little help with bathing/dressing/bathroom;Assistance with cooking/housework;Assist for transportation;Help with stairs or ramp for entrance   Can travel by private vehicle   Yes    Equipment Recommendations Other (comment) (TBD at next venue of care)  Recommendations for Other Services       Functional Status Assessment Patient has had a recent decline in their functional status and demonstrates the ability to make significant improvements in function in a reasonable and predictable amount of time.     Precautions / Restrictions  Precautions Precautions: Fall Recall of Precautions/Restrictions: Impaired Restrictions Weight Bearing Restrictions Per Provider Order: No Other Position/Activity Restrictions: no official weight bearing orders-treated L radial A-line as NWB during session      Mobility  Bed Mobility Overal bed mobility: Needs Assistance Bed Mobility: Supine to Sit, Sit to Supine     Supine to sit: Contact guard, +2 for safety/equipment Sit to supine: Contact guard assist, +2 for physical assistance   General bed mobility comments: +2 for lines/leads management, pt's impulsivity and inability to maintain NWB through LUE where A-line is; needed hand over hand/constant cueing; declined chair transfer d/t not feeling well after sitting up although vitals were stable    Transfers                   General transfer comment: pt declined due to not feeling well    Ambulation/Gait                  Stairs            Wheelchair Mobility     Tilt Bed    Modified Rankin (Stroke Patients Only)       Balance Overall balance assessment: Needs assistance Sitting-balance support: Feet supported, Single extremity supported Sitting balance-Leahy Scale: Fair                                       Pertinent Vitals/Pain Pain Assessment Pain Assessment: Faces Faces Pain Scale: Hurts little more Pain Location: L groin incision site Pain Descriptors / Indicators: Operative site guarding Pain Intervention(s): Limited activity within patient's tolerance, Monitored during session, Repositioned  Home Living Family/patient expects to be discharged to:: Private residence Living Arrangements: Children Available Help at Discharge: Family;Available PRN/intermittently Type of Home: House Home Access: Level entry       Home Layout: One level Home Equipment: None Additional Comments: pt loopy/still coming off anesthesia and unable to provide full history; reports she  lives with dtr Brianna Roth, but will be moving in with other dtr Brianna Roth on DC, but has to call to let Brianna Roth know that?    Prior Function Prior Level of Function : Independent/Modified Independent             Mobility Comments: reports IND without AD use; reports stairs were difficult d/t leg pain ADLs Comments: IND     Extremity/Trunk Assessment   Upper Extremity Assessment Upper Extremity Assessment: Defer to OT evaluation    Lower Extremity Assessment Lower Extremity Assessment: Generalized weakness (able to lift BLE against gravity) LLE Deficits / Details: sore s/p endarterectomies       Communication   Communication Communication: No apparent difficulties    Cognition Arousal: Alert Behavior During Therapy: Impulsive                             Following commands: Impaired Following commands impaired: Follows one step commands inconsistently     Cueing Cueing Techniques: Verbal cues, Gestural cues, Tactile cues     General Comments General comments (skin integrity, edema, etc.): VSS with session; dressing intact to groin incision as well as IV and A-line intact pre/post session    Exercises     Assessment/Plan    PT Assessment Patient needs continued PT services  PT Problem List Decreased strength;Decreased range of motion;Decreased activity tolerance;Pain;Decreased mobility;Decreased knowledge of precautions       PT Treatment Interventions DME instruction;Balance training;Gait training;Neuromuscular re-education;Stair training;Patient/family education;Functional mobility training;Therapeutic activities;Therapeutic exercise    PT Goals (Current goals can be found in the Care Plan section)  Acute Rehab PT Goals Patient Stated Goal: to feel better PT Goal Formulation: With patient Time For Goal Achievement: 06/26/23 Potential to Achieve Goals: Good    Frequency Min 2X/week     Co-evaluation               AM-PAC PT "6 Clicks"  Mobility  Outcome Measure Help needed turning from your back to your side while in a flat bed without using bedrails?: A Little Help needed moving from lying on your back to sitting on the side of a flat bed without using bedrails?: A Little Help needed moving to and from a bed to a chair (including a wheelchair)?: A Little Help needed standing up from a chair using your arms (e.g., wheelchair or bedside chair)?: A Little Help needed to walk in hospital room?: A Little Help needed climbing 3-5 steps with a railing? : A Little 6 Click Score: 18    End of Session   Activity Tolerance: Other (comment) (pt self lmiting, stated " i dont feet good" but did not qualify) Patient left: in bed;with call bell/phone within reach;with bed alarm set Nurse Communication: Mobility status PT Visit Diagnosis: Other abnormalities of gait and mobility (R26.89);Difficulty in walking, not elsewhere classified (R26.2);Muscle weakness (generalized) (M62.81)    Time: 5784-6962 PT Time Calculation (min) (ACUTE ONLY): 17 min   Charges:   PT Evaluation $PT Eval Low Complexity: 1 Low   PT General Charges $$ ACUTE PT VISIT: 1 Visit       Darien Eden PT,  DPT 3:46 PM,06/12/23

## 2023-06-12 NOTE — Anesthesia Procedure Notes (Signed)
 Procedure Name: Intubation Date/Time: 06/12/2023 7:38 AM  Performed by: Aniceto Kern, RNPre-anesthesia Checklist: Patient identified, Patient being monitored, Timeout performed, Emergency Drugs available and Suction available Patient Re-evaluated:Patient Re-evaluated prior to induction Oxygen Delivery Method: Circle system utilized Preoxygenation: Pre-oxygenation with 100% oxygen Induction Type: IV induction Ventilation: Mask ventilation without difficulty and Oral airway inserted - appropriate to patient size Laryngoscope Size: Mac and 3 Grade View: Grade I Tube type: Oral Tube size: 7.0 mm Number of attempts: 1 Airway Equipment and Method: Stylet and Video-laryngoscopy Placement Confirmation: ETT inserted through vocal cords under direct vision, positive ETCO2 and breath sounds checked- equal and bilateral Secured at: 21 cm Tube secured with: Tape Dental Injury: Teeth and Oropharynx as per pre-operative assessment

## 2023-06-13 ENCOUNTER — Encounter: Payer: Self-pay | Admitting: Vascular Surgery

## 2023-06-13 LAB — SURGICAL PATHOLOGY

## 2023-06-13 MED ORDER — FAMOTIDINE 20 MG PO TABS
20.0000 mg | ORAL_TABLET | Freq: Two times a day (BID) | ORAL | Status: DC
  Administered 2023-06-13 – 2023-06-17 (×8): 20 mg via ORAL
  Filled 2023-06-13 (×8): qty 1

## 2023-06-13 NOTE — Progress Notes (Signed)
 Physical Therapy Treatment Patient Details Name: Brianna Roth MRN: [id number] DOB: 08-Feb-1961 Today's Date: 06/13/2023   History of Present Illness Pt is a 63 year old female presents for planned surgery is now s/p common femoral, profunda femoris, and superficial femoral artery endarterectomies and patch angioplasty. PMH: asthma, HTN, CAD, PVD, CHF, anxiety, depression, CVA, several heart attacks.    PT Comments  Pt alert, impulsive throughout session, did report 10/10 L groin pain with mobility, RN in room to give medication at end of session. She was able to perform supine to sit supervision, prior to PT being ready for pt to mobilize. Sit <> stand with CGA several times, intermittent handheld assist due to pt complaints of pain. Able to perform pericare supervision, and able to ambulate ~31ft with CGA-handheld assist. Pt up in chair with needs in reach, RN at bedside. Care team updated of pt progress. The patient would benefit from further skilled PT intervention to continue to progress towards goals.    If plan is discharge home, recommend the following: A little help with walking and/or transfers;A little help with bathing/dressing/bathroom;Assistance with cooking/housework;Assist for transportation;Help with stairs or ramp for entrance   Can travel by private vehicle     Yes  Equipment Recommendations  None recommended by PT    Recommendations for Other Services       Precautions / Restrictions Precautions Precautions: Fall Recall of Precautions/Restrictions: Impaired Restrictions Weight Bearing Restrictions Per Provider Order: No     Mobility  Bed Mobility Overal bed mobility: Needs Assistance Bed Mobility: Supine to Sit, Sit to Supine     Supine to sit: Supervision     General bed mobility comments: impulsive    Transfers Overall transfer level: Needs assistance Equipment used: None Transfers: Sit to/from Stand, Bed to chair/wheelchair/BSC Sit to Stand:  Contact guard assist   Step pivot transfers: Contact guard assist            Ambulation/Gait Ambulation/Gait assistance: Contact guard assist Gait Distance (Feet): 20 Feet Assistive device: None             Stairs             Wheelchair Mobility     Tilt Bed    Modified Rankin (Stroke Patients Only)       Balance Overall balance assessment: Needs assistance Sitting-balance support: Feet supported, Single extremity supported Sitting balance-Leahy Scale: Fair     Standing balance support: Single extremity supported Standing balance-Leahy Scale: Good Standing balance comment: pericare in standing                            Communication Communication Communication: No apparent difficulties  Cognition Arousal: Alert Behavior During Therapy: Impulsive                             Following commands: Impaired Following commands impaired: Follows one step commands with increased time    Cueing Cueing Techniques: Verbal cues, Gestural cues, Tactile cues  Exercises      General Comments        Pertinent Vitals/Pain Pain Assessment Pain Assessment: 0-10 Pain Score: 10-Worst pain ever Pain Location: L groin incision site Pain Descriptors / Indicators: Operative site guarding, Sharp, Shooting, Sore Pain Intervention(s): Limited activity within patient's tolerance, Monitored during session, Premedicated before session, Repositioned, Patient requesting pain meds-RN notified    Home Living  Prior Function            PT Goals (current goals can now be found in the care plan section) Progress towards PT goals: Progressing toward goals    Frequency    Min 2X/week      PT Plan      Co-evaluation              AM-PAC PT "6 Clicks" Mobility   Outcome Measure  Help needed turning from your back to your side while in a flat bed without using bedrails?: None Help needed moving  from lying on your back to sitting on the side of a flat bed without using bedrails?: None Help needed moving to and from a bed to a chair (including a wheelchair)?: None Help needed standing up from a chair using your arms (e.g., wheelchair or bedside chair)?: None Help needed to walk in hospital room?: A Little Help needed climbing 3-5 steps with a railing? : A Little 6 Click Score: 22    End of Session   Activity Tolerance: Patient tolerated treatment well Patient left: in chair;with call bell/phone within reach;with chair alarm set;with nursing/sitter in room Nurse Communication: Mobility status PT Visit Diagnosis: Other abnormalities of gait and mobility (R26.89);Difficulty in walking, not elsewhere classified (R26.2);Muscle weakness (generalized) (M62.81)     Time: 5638-7564 PT Time Calculation (min) (ACUTE ONLY): 20 min  Charges:    $Therapeutic Activity: 8-22 mins PT General Charges $$ ACUTE PT VISIT: 1 Visit                     Darien Eden PT, DPT 12:28 PM,06/13/23

## 2023-06-13 NOTE — Progress Notes (Signed)
   06/13/23 1030  Spiritual Encounters  Type of Visit Initial  Care provided to: Patient  Referral source Nurse (RN/NT/LPN)  Reason for visit Grief/loss (Pt stated she lost her 39yr old grandson in Sept. (shot) and her fiancee shortly after from cancer.)  OnCall Visit Yes  Spiritual Framework  Presenting Themes Meaning/purpose/sources of inspiration;Impactful experiences and emotions;Courage hope and growth  Interventions  Spiritual Care Interventions Made Established relationship of care and support;Compassionate presence;Reflective listening;Normalization of emotions;Meaning making;Supported grief process  Intervention Outcomes  Outcomes Connection to spiritual care;Awareness around self/spiritual resourses;Awareness of support;Reduced anxiety

## 2023-06-13 NOTE — Plan of Care (Signed)
  Problem: Clinical Measurements: Goal: Ability to maintain clinical measurements within normal limits will improve Outcome: Progressing Goal: Will remain free from infection Outcome: Progressing Goal: Diagnostic test results will improve Outcome: Progressing Goal: Respiratory complications will improve Outcome: Progressing Goal: Cardiovascular complication will be avoided Outcome: Progressing   Problem: Activity: Goal: Risk for activity intolerance will decrease Outcome: Progressing   Problem: Nutrition: Goal: Adequate nutrition will be maintained Outcome: Progressing   Problem: Coping: Goal: Level of anxiety will decrease Outcome: Progressing   Problem: Elimination: Goal: Will not experience complications related to bowel motility Outcome: Progressing Goal: Will not experience complications related to urinary retention Outcome: Progressing   Problem: Pain Managment: Goal: General experience of comfort will improve and/or be controlled Outcome: Progressing   Problem: Safety: Goal: Ability to remain free from injury will improve Outcome: Progressing

## 2023-06-13 NOTE — Progress Notes (Signed)
 Progress Note    06/13/2023 9:11 AM 1 Day Post-Op  Subjective:  Brianna Roth is a 63 yo female now POD #1 from Left common femoral, profunda femoris, and superficial femoral artery endarterectomies and patch angioplasty. Patient is resting comfortably in bed this morning. Endorses pain and soreness to her left groin. Recovering as expected. No other complaints overnight. Foley in place and arterial blood pressure line in place. Vitals all remain stable    Vitals:   06/13/23 0700 06/13/23 0800  BP: (!) 108/58 100/60  Pulse: 61 (!) 50  Resp: 20 16  Temp:  98 F (36.7 C)  SpO2: 94% 95%   Physical Exam: Cardiac:  RRR, Normal S1,S2. No rubs clicks or gallup's or murmurs appreciated.  Lungs:  Normal labored breathing, clear on auscultation throughout. No rales rhonchi or wheezing.  Incisions:  Left Groin incision with dressing clean dry and intact.  Extremities: Bilateral lower extremities warm to touch with palpable pulses. Abdomen: Positive bowel sounds throughout, soft, nontender nondistended. Neurologic: Alert and oriented x 3, answers all questions and follows commands appropriately.  CBC    Component Value Date/Time   WBC 7.3 06/12/2023 2337   RBC 3.57 (L) 06/12/2023 2337   HGB 12.9 06/12/2023 2337   HGB 15.6 06/20/2013 1722   HCT 37.8 06/12/2023 2337   HCT 46.2 06/20/2013 1722   PLT 149 (L) 06/12/2023 2337   PLT 261 06/20/2013 1722   MCV 105.9 (H) 06/12/2023 2337   MCV 102 (H) 06/20/2013 1722   MCH 36.1 (H) 06/12/2023 2337   MCHC 34.1 06/12/2023 2337   RDW 12.0 06/12/2023 2337   RDW 12.8 06/20/2013 1722   LYMPHSABS 2.0 06/06/2023 1004   LYMPHSABS 1.9 06/20/2013 1722   MONOABS 0.5 06/06/2023 1004   MONOABS 0.7 06/20/2013 1722   EOSABS 0.2 06/06/2023 1004   EOSABS 0.1 06/20/2013 1722   BASOSABS 0.1 06/06/2023 1004   BASOSABS 0.1 06/20/2013 1722    BMET    Component Value Date/Time   NA 137 06/12/2023 2337   NA 138 06/20/2013 1722   K 4.6 06/12/2023 2337   K  4.7 06/20/2013 1722   CL 107 06/12/2023 2337   CL 106 06/20/2013 1722   CO2 24 06/12/2023 2337   CO2 22 06/20/2013 1722   GLUCOSE 120 (H) 06/12/2023 2337   GLUCOSE 161 (H) 06/20/2013 1722   BUN 9 06/12/2023 2337   BUN 11 06/20/2013 1722   CREATININE 0.57 06/12/2023 2337   CREATININE 0.45 (L) 06/20/2013 1722   CALCIUM  9.1 06/12/2023 2337   CALCIUM  9.3 06/20/2013 1722   GFRNONAA >60 06/12/2023 2337   GFRNONAA >60 06/20/2013 1722   GFRAA >60 07/31/2019 1141   GFRAA >60 06/20/2013 1722    INR    Component Value Date/Time   INR 1.07 03/18/2018 0403   INR 0.9 06/20/2013 1722     Intake/Output Summary (Last 24 hours) at 06/13/2023 0911 Last data filed at 06/13/2023 0901 Gross per 24 hour  Intake 3285.27 ml  Output 1900 ml  Net 1385.27 ml     Assessment/Plan:  63 y.o. female is s/p Left common femoral, superficial femoral and profunda femoris endarterectomy with bovine pericardial patch angioplasty 1 Day Post-Op   PLAN Start ASA 81 mg daily, Plavix  75 mg daily and Lipitor 40 mg daily. Discontinue arterial blood pressure monitoring. Continue Foley catheter. PT/OT consults. Advance diet as tolerated. Pain medication as needed. Okay to transfer to the floor. Plan is to possibly discharge tomorrow if doing well.  DVT prophylaxis: ASA 81 mg daily and Plavix  75 mg daily.   Annamaria Barrette [facility] 06/13/2023 9:11 AM

## 2023-06-13 NOTE — Progress Notes (Signed)
 Patient with orders to transfer to med/surg unit.  Report called to [facility] RN for room 216, all questions answered.  Patient transferred to room 216 via w/c with all belongings in her possession and Psychologist, occupational met patient in the room.

## 2023-06-13 NOTE — Plan of Care (Signed)

## 2023-06-14 NOTE — Progress Notes (Signed)
 Occupational Therapy Treatment Patient Details Name: Brianna Roth MRN: [id number] DOB: 07/06/1960 Today's Date: 06/14/2023   History of present illness Pt is a 63 year old female presents for planned surgery is now s/p common femoral, profunda femoris, and superficial femoral artery endarterectomies and patch angioplasty. PMH: asthma, HTN, CAD, PVD, CHF, anxiety, depression, CVA, several heart attacks.   OT comments  Upon entering the room, pt long sitting in bed with chaplain present. Pt is agreeable to OT intervention with c/o soreness in groin. Pt performs bed mobility independently and stands with supervision. Pt reports having just finished bathing and dressing prior to therapist arrival and declines toileting needs. Pt ambulates 400' without use of AD at supervision level. She is able to pick up small items from floor without LOB. Pt returning to room at end of session. Call bell and all needed items within reach upon exiting the room.       If plan is discharge home, recommend the following:  A little help with walking and/or transfers;A little help with bathing/dressing/bathroom;Assistance with cooking/housework;Assist for transportation;Help with stairs or ramp for entrance   Equipment Recommendations  None recommended by OT       Precautions / Restrictions Precautions Precautions: Fall Restrictions Weight Bearing Restrictions Per Provider Order: No       Mobility Bed Mobility Overal bed mobility: Modified Independent                  Transfers Overall transfer level: Needs assistance Equipment used: None Transfers: Sit to/from Stand Sit to Stand: Supervision                 Balance Overall balance assessment: Needs assistance Sitting-balance support: Feet supported, Single extremity supported Sitting balance-Leahy Scale: Good       Standing balance-Leahy Scale: Good                             ADL either performed or assessed with  clinical judgement   ADL Overall ADL's : Needs assistance/impaired                                       General ADL Comments: supervision overall without use of AD    Extremity/Trunk Assessment Upper Extremity Assessment Upper Extremity Assessment: Overall WFL for tasks assessed   Lower Extremity Assessment Lower Extremity Assessment: Overall WFL for tasks assessed        Vision Patient Visual Report: No change from baseline           Communication Communication Communication: No apparent difficulties   Cognition Arousal: Alert Behavior During Therapy: WFL for tasks assessed/performed Cognition: No apparent impairments                               Following commands: Intact Following commands impaired: Follows one step commands with increased time      Cueing   Cueing Techniques: Verbal cues, Gestural cues, Tactile cues             Pertinent Vitals/ Pain       Pain Assessment Pain Assessment: Faces Faces Pain Scale: Hurts a little bit Pain Location: L groin incision site Pain Descriptors / Indicators: Operative site guarding, Sore, Discomfort Pain Intervention(s): Limited activity within patient's tolerance, Monitored during session, Premedicated before session, Repositioned  Frequency  Min 2X/week        Progress Toward Goals  OT Goals(current goals can now be found in the care plan section)  Progress towards OT goals: Progressing toward goals      AM-PAC OT "6 Clicks" Daily Activity     Outcome Measure   Help from another person eating meals?: None Help from another person taking care of personal grooming?: None Help from another person toileting, which includes using toliet, bedpan, or urinal?: None Help from another person bathing (including washing, rinsing, drying)?: None Help from another person to put on and taking off regular upper body clothing?: None Help from another person to put on and taking  off regular lower body clothing?: None 6 Click Score: 24    End of Session    OT Visit Diagnosis: Other abnormalities of gait and mobility (R26.89);Muscle weakness (generalized) (M62.81);Pain Pain - Right/Left: Left Pain - part of body: Hip;Leg   Activity Tolerance Patient tolerated treatment well   Patient Left in bed;with call bell/phone within reach;with bed alarm set   Nurse Communication Mobility status        Time: 4782-9562 OT Time Calculation (min): 14 min  Charges: OT General Charges $OT Visit: 1 Visit OT Treatments $Therapeutic Activity: 8-22 mins  George Kinder, MS, OTR/L , CBIS ascom [phone]  06/14/23, 3:32 PM

## 2023-06-14 NOTE — Progress Notes (Signed)
   06/14/23 1415  Spiritual Encounters  Type of Visit Initial  Care provided to: Patient  Conversation partners present during encounter Nurse  Reason for visit Routine spiritual support  OnCall Visit No   Overnight Chaplain requested follow-up on this patient.  Chaplain went since Unit Chaplain not present this afternoon.  Chaplain offered a compassionate presence and reflective listening.  Patient shared feelings of grief and traumatic experiences she's gone through.  Chaplain asked patient if she'd consider joining a grief support group when she's discharged.   Patient said she'd consider it but wants something aligned with her faith.    Rev. Rana M. Nolon Baxter, M.Div. Chaplain Resident [facility]

## 2023-06-14 NOTE — TOC Initial Note (Addendum)
 Transition of Care [facility]) - Initial/Assessment Note    Patient Details  Name: Brianna Roth MRN: [id number] Date of Birth: 1960-03-27  [facility] CM/SW Contact:    Brianna Bennett, LCSW Phone Number: 06/14/2023, 10:54 AM  Clinical Narrative:  CSW met with patient. No family at bedside. CSW introduced role and explained that therapy recommendations would be discussed. She is agreeable to home health if we can find an agency to accept her Medicaid. She does not have a PCP so will ask vascular if they will cover her orders. Patient is going home with her daughter Brianna Roth in [place]: [address], [place], [place] [address]. CSW called Brianna Roth while in the room and confirmed she is agreeable to home health and 3-in-1 as well. Daughter stated plan was for her to only stay for a couple of days but patient would like to stay longer. So far [facility], [facility], and [facility] have declined. Left a message for the [facility] liaison. If unable to secure home health, will try to get outpatient therapy. No further concerns. CSW will continue to follow patient for support and facilitate discharge once medically stable.               1:13 pm: Ordered 3-in-1 through [facility].  2:52 pm: Unable to secure home health. CSW called patient to notify and inquire about outpatient therapy. She stated she had just walked and should be fine without it. CSW encouraged her to notify team if she changes her mind.  Expected Discharge Plan: Home w Home Health Services Barriers to Discharge: Continued Medical Work up   Patient Goals and CMS Choice            Expected Discharge Plan and Services     Post Acute Care Choice: Durable Medical Equipment, Home Health Living arrangements for the past 2 months: Apartment                                      Prior Living Arrangements/Services Living arrangements for the past 2 months: Apartment Lives with:: Adult Children Patient  language and need for interpreter reviewed:: Yes Do you feel safe going back to the place where you live?: Yes      Need for Family Participation in Patient Care: Yes (Comment) Care giver support system in place?: Yes (comment)   Criminal Activity/Legal Involvement Pertinent to Current Situation/Hospitalization: No - Comment as needed  Activities of Daily Living   ADL Screening (condition at time of admission) Independently performs ADLs?: Yes (appropriate for developmental age) Is the patient deaf or have difficulty hearing?: No Does the patient have difficulty seeing, even when wearing glasses/contacts?: No Does the patient have difficulty concentrating, remembering, or making decisions?: Yes  Permission Sought/Granted Permission sought to share information with : Facility Medical sales representative, Family Supports Permission granted to share information with : Yes, Verbal Permission Granted  Share Information with NAME: Brianna Roth  Permission granted to share info w AGENCY: Home Health Agencies  Permission granted to share info w Relationship: Daughter  Permission granted to share info w Contact Information: [phone]  Emotional Assessment Appearance:: Appears stated age Attitude/Demeanor/Rapport: Engaged, Gracious Affect (typically observed): Accepting, Appropriate, Calm, Pleasant Orientation: : Oriented to Self, Oriented to Place, Oriented to  Time, Oriented to Situation Alcohol / Substance Use: Not Applicable Psych Involvement: No (comment)  Admission diagnosis:  Atherosclerosis of native artery of left lower extremity  with rest pain (HCC) [I70.222] Atherosclerosis of artery of extremity with rest pain [facility]) [I70.229] Patient Active Problem List   Diagnosis Date Noted   Atherosclerosis of artery of extremity with rest pain (HCC) 06/12/2023   Long-term use of aspirin  therapy    Atherosclerosis of native arteries of extremity with intermittent claudication (HCC)  05/29/2023   ST elevation myocardial infarction (STEMI) (HCC) 03/16/2018   Ischemic cardiomyopathy    Essential hypertension    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 [place] [address] Phone: [phone] Fax: [phone]     Social Drivers of Health (SDOH) Social History: SDOH Screenings   Food Insecurity: No Food Insecurity (06/12/2023)  Housing: Low Risk  (06/12/2023)  Transportation Needs: No Transportation Needs (06/12/2023)  Utilities: Not At Risk (06/12/2023)  Financial Resource Strain: High Risk (03/08/2022)   Received from [facility], [facility]  Physical Activity: Inactive (03/08/2022)   Received from [facility], [facility]  Social Connections: Moderately Isolated (03/08/2022)   Received from [facility], [facility]  Stress: Stress Concern Present (03/08/2022)   Received from [facility], [facility]  Tobacco Use: High Risk (06/12/2023)   SDOH Interventions:     Readmission Risk Interventions     No data to display

## 2023-06-14 NOTE — TOC CM/SW Note (Signed)
 Patient is not able to walk the distance required to go the bathroom, or he/she is unable to safely negotiate stairs required to access the bathroom.  A 3in1 BSC will alleviate this problem

## 2023-06-14 NOTE — Plan of Care (Signed)
  Problem: Clinical Measurements: Goal: Ability to maintain clinical measurements within normal limits will improve Outcome: Progressing Goal: Will remain free from infection Outcome: Progressing Goal: Diagnostic test results will improve Outcome: Progressing Goal: Respiratory complications will improve Outcome: Progressing Goal: Cardiovascular complication will be avoided Outcome: Progressing   Problem: Activity: Goal: Risk for activity intolerance will decrease Outcome: Progressing   Problem: Nutrition: Goal: Adequate nutrition will be maintained Outcome: Progressing   Problem: Coping: Goal: Level of anxiety will decrease Outcome: Progressing   Problem: Elimination: Goal: Will not experience complications related to bowel motility Outcome: Progressing Goal: Will not experience complications related to urinary retention Outcome: Progressing   Problem: Pain Managment: Goal: General experience of comfort will improve and/or be controlled Outcome: Progressing

## 2023-06-15 LAB — CBC
HCT: 41.3 % (ref 36.0–46.0)
Hemoglobin: 13.5 g/dL (ref 12.0–15.0)
MCH: 35.2 pg — ABNORMAL HIGH (ref 26.0–34.0)
MCHC: 32.7 g/dL (ref 30.0–36.0)
MCV: 107.8 fL — ABNORMAL HIGH (ref 80.0–100.0)
Platelets: 168 10*3/uL (ref 150–400)
RBC: 3.83 MIL/uL — ABNORMAL LOW (ref 3.87–5.11)
RDW: 12.1 % (ref 11.5–15.5)
WBC: 8 10*3/uL (ref 4.0–10.5)
nRBC: 0 % (ref 0.0–0.2)

## 2023-06-15 NOTE — Progress Notes (Signed)
    Subjective  - POD # 3, status post left femoral endarterectomy with patch angioplasty  Does not feel she is ready to go home Complaining of sharp pain on the anterior medial thigh  Physical Exam:  Left groin incision without complication       Assessment/Plan:  POD #3  Continue to mobilize.  She needs to be up in the hallway ambulating  Continue aspirin  statin and Plavix   [facility] Lenzy Kerschner 06/15/2023 1:36 PM --  Vitals:   06/14/23 1919 06/15/23 0305  BP: 102/63 125/80  Pulse: 67 67  Resp: 20 20  Temp: 98.3 F (36.8 C) 97.9 F (36.6 C)  SpO2: 94% 91%    Intake/Output Summary (Last 24 hours) at 06/15/2023 1336 Last data filed at 06/15/2023 0900 Gross per 24 hour  Intake 340 ml  Output 750 ml  Net -410 ml     Laboratory CBC    Component Value Date/Time   WBC 8.0 06/15/2023 0442   HGB 13.5 06/15/2023 0442   HGB 15.6 06/20/2013 1722   HCT 41.3 06/15/2023 0442   HCT 46.2 06/20/2013 1722   PLT 168 06/15/2023 0442   PLT 261 06/20/2013 1722    BMET    Component Value Date/Time   NA 137 06/12/2023 2337   NA 138 06/20/2013 1722   K 4.6 06/12/2023 2337   K 4.7 06/20/2013 1722   CL 107 06/12/2023 2337   CL 106 06/20/2013 1722   CO2 24 06/12/2023 2337   CO2 22 06/20/2013 1722   GLUCOSE 120 (H) 06/12/2023 2337   GLUCOSE 161 (H) 06/20/2013 1722   BUN 9 06/12/2023 2337   BUN 11 06/20/2013 1722   CREATININE 0.57 06/12/2023 2337   CREATININE 0.45 (L) 06/20/2013 1722   CALCIUM  9.1 06/12/2023 2337   CALCIUM  9.3 06/20/2013 1722   GFRNONAA >60 06/12/2023 2337   GFRNONAA >60 06/20/2013 1722   GFRAA >60 07/31/2019 1141   GFRAA >60 06/20/2013 1722    COAG Lab Results  Component Value Date   INR 1.07 03/18/2018   INR 1.01 03/17/2018   INR 0.95 03/16/2018   No results found for: "PTT"  Antibiotics Anti-infectives (From admission, onward)    Start     Dose/Rate Route Frequency Ordered Stop   06/12/23 1600  ceFAZolin  (ANCEF ) IVPB 2g/100 mL premix         2 g 200 mL/hr over 30 Minutes Intravenous Every 8 hours 06/12/23 1131 06/12/23 2359   06/12/23 0852  vancomycin  (VANCOCIN ) powder  Status:  Discontinued          As needed 06/12/23 0852 06/12/23 0959   06/12/23 0852  gentamicin  (GARAMYCIN ) injection  Status:  Discontinued          As needed 06/12/23 0852 06/12/23 0959   06/12/23 0615  ceFAZolin  (ANCEF ) IVPB 2g/100 mL premix        2 g 200 mL/hr over 30 Minutes Intravenous On call to O.R. 06/12/23 [id number] 06/12/23 0750        V. Marti Slates, M.D., [facility] [place] Office: [phone] Pager:  [phone]

## 2023-06-16 LAB — CBC
HCT: 44.8 % (ref 36.0–46.0)
Hemoglobin: 14.8 g/dL (ref 12.0–15.0)
MCH: 34.8 pg — ABNORMAL HIGH (ref 26.0–34.0)
MCHC: 33 g/dL (ref 30.0–36.0)
MCV: 105.4 fL — ABNORMAL HIGH (ref 80.0–100.0)
Platelets: 185 10*3/uL (ref 150–400)
RBC: 4.25 MIL/uL (ref 3.87–5.11)
RDW: 11.9 % (ref 11.5–15.5)
WBC: 9.2 10*3/uL (ref 4.0–10.5)
nRBC: 0 % (ref 0.0–0.2)

## 2023-06-16 LAB — GLUCOSE, CAPILLARY: Glucose-Capillary: 99 mg/dL (ref 70–99)

## 2023-06-16 NOTE — Plan of Care (Signed)

## 2023-06-16 NOTE — Progress Notes (Signed)
    Subjective  - POD #4, status post left femoral endarterectomy with patch angioplasty   Had some groin oozing yesterday SBP in 90's  Physical Exam:  Right groin dressing removed, incision c/D/I        Assessment/Plan:  POD #5  Will keep another day Acute blood loss anemia:  check CBC this am. BP:  SBP in 90's.  Wil hold BP meds today Left thigh has some redness.  Looks more like hematoma, but will need to re-evaluate tomorrow to make sure it is not cellulitis  [facility] Kamarie Palma 06/16/2023 10:42 AM --  Vitals:   06/16/23 0436 06/16/23 0822  BP: (!) 91/59 103/64  Pulse: 63 67  Resp: 15 18  Temp: 98.5 F (36.9 C) 98.6 F (37 C)  SpO2: 91% 91%    Intake/Output Summary (Last 24 hours) at 06/16/2023 1042 Last data filed at 06/15/2023 2055 Gross per 24 hour  Intake 480 ml  Output --  Net 480 ml     Laboratory CBC    Component Value Date/Time   WBC 8.0 06/15/2023 0442   HGB 13.5 06/15/2023 0442   HGB 15.6 06/20/2013 1722   HCT 41.3 06/15/2023 0442   HCT 46.2 06/20/2013 1722   PLT 168 06/15/2023 0442   PLT 261 06/20/2013 1722    BMET    Component Value Date/Time   NA 137 06/12/2023 2337   NA 138 06/20/2013 1722   K 4.6 06/12/2023 2337   K 4.7 06/20/2013 1722   CL 107 06/12/2023 2337   CL 106 06/20/2013 1722   CO2 24 06/12/2023 2337   CO2 22 06/20/2013 1722   GLUCOSE 120 (H) 06/12/2023 2337   GLUCOSE 161 (H) 06/20/2013 1722   BUN 9 06/12/2023 2337   BUN 11 06/20/2013 1722   CREATININE 0.57 06/12/2023 2337   CREATININE 0.45 (L) 06/20/2013 1722   CALCIUM  9.1 06/12/2023 2337   CALCIUM  9.3 06/20/2013 1722   GFRNONAA >60 06/12/2023 2337   GFRNONAA >60 06/20/2013 1722   GFRAA >60 07/31/2019 1141   GFRAA >60 06/20/2013 1722    COAG Lab Results  Component Value Date   INR 1.07 03/18/2018   INR 1.01 03/17/2018   INR 0.95 03/16/2018   No results found for: "PTT"  Antibiotics Anti-infectives (From admission, onward)    Start     Dose/Rate Route  Frequency Ordered Stop   06/12/23 1600  ceFAZolin  (ANCEF ) IVPB 2g/100 mL premix        2 g 200 mL/hr over 30 Minutes Intravenous Every 8 hours 06/12/23 1131 06/12/23 2359   06/12/23 0852  vancomycin  (VANCOCIN ) powder  Status:  Discontinued          As needed 06/12/23 0852 06/12/23 0959   06/12/23 0852  gentamicin  (GARAMYCIN ) injection  Status:  Discontinued          As needed 06/12/23 0852 06/12/23 0959   06/12/23 0615  ceFAZolin  (ANCEF ) IVPB 2g/100 mL premix        2 g 200 mL/hr over 30 Minutes Intravenous On call to O.R. 06/12/23 [id number] 06/12/23 0750        V. Marti Slates, M.D., [facility] [place] Office: [phone] Pager:  [phone]

## 2023-06-17 ENCOUNTER — Other Ambulatory Visit: Payer: Self-pay

## 2023-06-17 DIAGNOSIS — Z9889 Other specified postprocedural states: Secondary | ICD-10-CM

## 2023-06-17 DIAGNOSIS — I70222 Atherosclerosis of native arteries of extremities with rest pain, left leg: Secondary | ICD-10-CM

## 2023-06-17 MED ORDER — OXYCODONE-ACETAMINOPHEN 5-325 MG PO TABS
1.0000 | ORAL_TABLET | ORAL | 0 refills | Status: DC | PRN
  Filled 2023-06-17: qty 20, 2d supply, fill #0

## 2023-06-17 MED ORDER — ATORVASTATIN CALCIUM 40 MG PO TABS: 40.0000 mg | ORAL_TABLET | Freq: Every day | ORAL | 3 refills | Status: AC

## 2023-06-17 MED ORDER — ASPIRIN 81 MG PO CHEW: 81.0000 mg | CHEWABLE_TABLET | Freq: Every day | ORAL | 0 refills | Status: AC

## 2023-06-17 MED ORDER — CLOPIDOGREL BISULFATE 75 MG PO TABS: 75.0000 mg | ORAL_TABLET | Freq: Every day | ORAL | 3 refills | Status: AC

## 2023-06-17 NOTE — Progress Notes (Signed)
 Physical Therapy Treatment Patient Details Name: Brianna Roth MRN: [id number] DOB: 06/15/1960 Today's Date: 06/17/2023   History of Present Illness Pt is a 63 year old female presents for planned surgery is now s/p common femoral, profunda femoris, and superficial femoral artery endarterectomies and patch angioplasty. PMH: asthma, HTN, CAD, PVD, CHF, anxiety, depression, CVA, several heart attacks.    PT Comments  Pt was pleasant and put forth good effort throughout the session with min encouragement regarding benefits of mobility.  Pt demonstrated good speed and effort with bed mobility tasks and good eccentric and concentric control and stability with transfers from various surfaces and while ascending/descending stairs.  Pt was able to amb 2 x 150' without an AD with slow cadence but steady with no overt LOB.  Pt reported no adverse symptoms during the session other than 5/10 L groin pain with nursing notified.  Pt's SpO2 and HR both WNL throughout on room air.   Pt will benefit from continued PT services upon discharge to safely address deficits listed in patient problem list for decreased caregiver assistance and eventual return to PLOF.       If plan is discharge home, recommend the following: A little help with walking and/or transfers;A little help with bathing/dressing/bathroom;Assistance with cooking/housework;Assist for transportation;Help with stairs or ramp for entrance   Can travel by private vehicle     Yes  Equipment Recommendations  None recommended by PT    Recommendations for Other Services       Precautions / Restrictions Precautions Precautions: Fall Recall of Precautions/Restrictions: Impaired Restrictions Weight Bearing Restrictions Per Provider Order: No     Mobility  Bed Mobility Overal bed mobility: Independent             General bed mobility comments: Good speed and control with bed mobility tasks    Transfers Overall transfer level: Needs  assistance Equipment used: None Transfers: Sit to/from Stand Sit to Stand: Supervision           General transfer comment: Good eccentric and concentric control and stability from various height surfaces    Ambulation/Gait Ambulation/Gait assistance: Supervision Gait Distance (Feet): 150 Feet Assistive device: None Gait Pattern/deviations: Step-through pattern, Decreased step length - right, Decreased step length - left Gait velocity: decreased     General Gait Details: Min reduced cadence grossly but generally steady with gait without an AD with no overt LOB including during start/stops and 180 deg turns   Stairs Stairs: Yes Stairs assistance: Supervision Stair Management: One rail Left, Step to pattern, Forwards, Alternating pattern Number of Stairs: 4 General stair comments: Pt able to ascend and descend 4 steps x 1 and then 2 steps x 1 with alternating pattern initially but education provided on step-to pattern to address L groin soreness, good stability throughout   Wheelchair Mobility     Tilt Bed    Modified Rankin (Stroke Patients Only)       Balance Overall balance assessment: Needs assistance   Sitting balance-Leahy Scale: Normal     Standing balance support: No upper extremity supported, During functional activity Standing balance-Leahy Scale: Good                              Communication Communication Communication: No apparent difficulties  Cognition Arousal: Alert Behavior During Therapy: WFL for tasks assessed/performed   PT - Cognitive impairments: No apparent impairments  Following commands: Intact Following commands impaired: Follows one step commands with increased time    Cueing Cueing Techniques: Verbal cues, Tactile cues, Visual cues  Exercises      General Comments        Pertinent Vitals/Pain Pain Assessment Pain Assessment: 0-10 Pain Score: 5  Pain Location: L groin  incision site Pain Descriptors / Indicators: Sore Pain Intervention(s): Monitored during session, Patient requesting pain meds-RN notified    Home Living                          Prior Function            PT Goals (current goals can now be found in the care plan section) Progress towards PT goals: Progressing toward goals    Frequency    Min 2X/week      PT Plan      Co-evaluation              AM-PAC PT "6 Clicks" Mobility   Outcome Measure  Help needed turning from your back to your side while in a flat bed without using bedrails?: None Help needed moving from lying on your back to sitting on the side of a flat bed without using bedrails?: None Help needed moving to and from a bed to a chair (including a wheelchair)?: None Help needed standing up from a chair using your arms (e.g., wheelchair or bedside chair)?: A Little Help needed to walk in hospital room?: A Little Help needed climbing 3-5 steps with a railing? : A Little 6 Click Score: 21    End of Session Equipment Utilized During Treatment: Gait belt Activity Tolerance: Patient tolerated treatment well Patient left: in bed;with nursing/sitter in room;with call bell/phone within reach Nurse Communication: Mobility status PT Visit Diagnosis: Other abnormalities of gait and mobility (R26.89);Difficulty in walking, not elsewhere classified (R26.2);Muscle weakness (generalized) (M62.81)     Time: 6962-9528 PT Time Calculation (min) (ACUTE ONLY): 17 min  Charges:    $Gait Training: 8-22 mins PT General Charges $$ ACUTE PT VISIT: 1 Visit                     D. Scott Kaimani Clayson PT, DPT 06/17/23, 10:38 AM

## 2023-06-17 NOTE — Progress Notes (Signed)
 Occupational Therapy Treatment Patient Details Name: Brianna Roth MRN: [id number] DOB: [birth year] Today's Date: 06/17/2023   History of present illness Pt is a 63 year old female presents for planned surgery is now s/p common femoral, profunda femoris, and superficial femoral artery endarterectomies and patch angioplasty. PMH: asthma, HTN, CAD, PVD, CHF, anxiety, depression, CVA, several heart attacks.   OT comments  Pt is supine in bed on arrival. Pleasant and agreeable to OT session. She does not complain of pain during session, but requested a nausea pill at end of session with nurse notified. Pt performed bed mobility independently and all bathing and dressing tasks throughout session with SUP and no use of AE/AD. Pt less impulsive today and was edu on use of AE/AD as needed to maximize safety at home.  Pt returned to bed with all needs in place and will cont to require skilled acute OT services to maximize her safety and IND to return to PLOF.       If plan is discharge home, recommend the following:  Assistance with cooking/housework;Assist for transportation;Help with stairs or ramp for entrance   Equipment Recommendations  None recommended by OT    Recommendations for Other Services      Precautions / Restrictions Precautions Precautions: Fall Recall of Precautions/Restrictions: Impaired Restrictions Weight Bearing Restrictions Per Provider Order: No       Mobility Bed Mobility Overal bed mobility: Independent                  Transfers Overall transfer level: Needs assistance Equipment used: None Transfers: Sit to/from Stand Sit to Stand: Supervision                 Balance Overall balance assessment: Needs assistance Sitting-balance support: Feet supported, Single extremity supported Sitting balance-Leahy Scale: Normal     Standing balance support: No upper extremity supported, During functional activity Standing balance-Leahy Scale: Good                              ADL either performed or assessed with clinical judgement   ADL Overall ADL's : Needs assistance/impaired     Grooming: Wash/dry face;Wash/dry hands;Supervision/safety;Standing   Upper Body Bathing: Supervision/ safety;Standing   Lower Body Bathing: Supervison/ safety;Sit to/from stand;Sitting/lateral leans   Upper Body Dressing : Set up;Sitting   Lower Body Dressing: Supervision/safety;Sit to/from stand;Sitting/lateral leans   Toilet Transfer: Supervision/safety                  Extremity/Trunk Insurance account manager Communication Communication: No apparent difficulties   Cognition Arousal: Alert Behavior During Therapy: WFL for tasks assessed/performed Cognition: No apparent impairments                               Following commands: Intact        Cueing   Cueing Techniques: Verbal cues, Tactile cues, Visual cues  Exercises      Shoulder Instructions       General Comments      Pertinent Vitals/ Pain       Pain Assessment Pain Assessment: Faces Faces Pain Scale: Hurts a little bit Pain Location: L groin incision site Pain Descriptors / Indicators: Sore Pain Intervention(s): Monitored during  session, Repositioned  Home Living                                          Prior Functioning/Environment              Frequency  Min 2X/week        Progress Toward Goals  OT Goals(current goals can now be found in the care plan section)  Progress towards OT goals: Progressing toward goals  Acute Rehab OT Goals Patient Stated Goal: return home OT Goal Formulation: With patient Time For Goal Achievement: 06/26/23 Potential to Achieve Goals: Good  Plan      Co-evaluation                 AM-PAC OT "6 Clicks" Daily Activity     Outcome Measure   Help from another person eating meals?: None Help  from another person taking care of personal grooming?: None Help from another person toileting, which includes using toliet, bedpan, or urinal?: None Help from another person bathing (including washing, rinsing, drying)?: None Help from another person to put on and taking off regular upper body clothing?: None Help from another person to put on and taking off regular lower body clothing?: None 6 Click Score: 24    End of Session    OT Visit Diagnosis: Other abnormalities of gait and mobility (R26.89);Muscle weakness (generalized) (M62.81);Pain Pain - Right/Left: Left Pain - part of body: Hip;Leg   Activity Tolerance Patient tolerated treatment well   Patient Left in bed;with call bell/phone within reach   Nurse Communication Mobility status        Time: 1610-9604 OT Time Calculation (min): 29 min  Charges: OT General Charges $OT Visit: 1 Visit OT Treatments $Self Care/Home Management : 23-37 mins  Neysa Arts, OTR/L  06/17/23, 12:22 PM   Raygen Dahm E Zania Kalisz 06/17/2023, 12:18 PM

## 2023-06-17 NOTE — Discharge Summary (Signed)
 [facility]    Discharge Summary    Patient ID:  Brianna Roth MRN: [id number] DOB/AGE: [birth year] 63 y.o.  Admit date: 06/12/2023 Discharge date: 06/17/2023 Date of Surgery: 06/12/2023 Surgeon: Surgeon(s): Schnier, Ninette Basque, MD [employer], MD  Admission Diagnosis: Atherosclerosis of native artery of left lower extremity with rest pain [facility]) [I70.222] Atherosclerosis of artery of extremity with rest pain [facility]) [I70.229]  Discharge Diagnoses:  Atherosclerosis of native artery of left lower extremity with rest pain (HCC) [I70.222] Atherosclerosis of artery of extremity with rest pain (HCC) [I70.229]  Secondary Diagnoses: Past Medical History:  Diagnosis Date   Anginal pain (HCC)    Anxiety    Aortic atherosclerosis (HCC)    Asthma    Cancer (HCC)    Coronary artery disease 06/09/2008   a.) antlat STEMI 06/09/2008 -> LHC/PCI 06/10/2008: 100 mLAD (thrombectomy + 2.75 x 24mm Micro-driver BMS), 50 mRCA, 10 oLM, 30 RI, 90 D2; b.) inf STEMI 06/19/2013 - LHC/PCI 06/20/2013: 100 lat RI, 50 mRCA, 99 dRCA (Xience Alpine DES); c.) inf STEMI 03/16/2018 -> LHC/PCI: 70 mRCA (2.75 x 15mm Resolute Onyx DES), 100 dRCA (overlapping 2.25 x 16mm + 2.25 x 18mm Resolute Onyx DES), 75 oRPDA, 30 p-mLAD   Depression    Dyspnea    Febrile seizures (during childhood)    HFrEF (heart failure with reduced ejection fraction) (HCC) 05/2008   a.) due to post-infarct ischemia cardiomyopathy; EF by LHC 25% on 06/10/2008 with anterior wall akinesis   History of heart artery stent    a.) TOTAL OF: 5 stents (LAD, m-dRCA) as of 06/11/2023   Hyperlipidemia    Hypertension    Ischemic cardiomyopathy 05/2008   a.) post-anterolateral infarct; EF 25% via LHC with anterior wall akinesis   Long term current use of clopidogrel     Long-term use of aspirin  therapy    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   PVD (peripheral vascular disease) (HCC)    ST  elevation myocardial infarction (STEMI) of anterolateral wall (HCC) 06/09/2008   a.) anterolateral STEMI 06/09/2008 --> [facility] intervention at [facility] d/t mental status changes and concerns for CVA; MRI showed old CVAs; b.) LCH/PCI 06/10/2008: 100% mLAD (mechanical thrombectomy + 2.75 x 24 mm Micro-driver BMS), 95% mRCA, 62% oLM, 30% RI, 90% D2   ST elevation myocardial infarction (STEMI) of inferior wall (HCC) 03/16/2018   a.) LHC/PCI 03/16/2018: 70% mRCA (2.75 x 15 mm Resolute Onyx DES), 100% dRCA (overlapping 2.25 x 16 mm and 2.25 x 18 mm Resolute Oxyx DES), 75% oRPDA, 30% p-mLAD   ST elevation myocardial infarction (STEMI) of inferior wall (HCC) 06/19/2013   a.) LHC/PCI 06/20/2013: 99% dRCA (Xience Alpine DES)   Stroke (HCC)    a.) brain MRI 06/09/2008: areas of encephalomalacia involving the RIGHT frontal operculum and high RIGHT frontoparietal region c/w sequela of remote infarction   Tobacco abuse     Procedure(s): ENDARTERECTOMY, FEMORAL APPLICATION OF CELL SAVER  Discharged Condition: good  HPI:  Brianna Roth is a 63 year old female now postop day 5 from left femoral endarterectomy with patch angioplasty.  Patient is resting comfortably in bed.  Left groin with staples intact clean and dry.  No seroma infection to note.  There is a small area right under the skin quarter size that could resemble slight hematoma but no complications from it to note.  Patient is eating voiding and ambulating well.  Patient to be discharged later today.  Patient being discharged on aspirin  30  mg daily, Plavix  75 mg daily and Lipitor 40 mg daily.  She was instructed not to skip or miss any of these medications as it will alter the outcome of her surgery.  She verbalizes her understanding.  Hospital Course:  Brianna Roth is a 63 y.o. female is S/P Left Extubated: POD # 0 Physical Exam:  Alert notes x3, no acute distress Face: Symmetrical.  Tongue is midline. Neck: Trachea is midline.  No swelling or  bruising. Cardiovascular: Regular rate and rhythm Pulmonary: Clear to auscultation bilaterally Abdomen: Soft, nontender, nondistended Right groin access: Clean dry and intact.  No swelling or drainage noted Left groin access: Clean dry and intact.  No swelling or drainage noted Left lower extremity: Thigh soft.  Calf soft.  Extremities warm distally toes. Hard to palpate pedal pulses however the foot is warm is her good capillary refill. Right lower extremity: Thigh soft.  Calf soft.  Extremities warm distally toes. Hard to palpate pedal pulses however the foot is warm is her good capillary refill. Neurological: No deficits noted   Post-op wounds:  clean, dry, intact or healing well  Pt. Ambulating, voiding and taking PO diet without difficulty. Pt pain controlled with PO pain meds.  Labs:  As below  Complications: none  Consults:    Significant Diagnostic Studies: CBC Lab Results  Component Value Date   WBC 9.2 06/16/2023   HGB 14.8 06/16/2023   HCT 44.8 06/16/2023   MCV 105.4 (H) 06/16/2023   PLT 185 06/16/2023    BMET    Component Value Date/Time   NA 137 06/12/2023 2337   NA 138 06/20/2013 1722   K 4.6 06/12/2023 2337   K 4.7 06/20/2013 1722   CL 107 06/12/2023 2337   CL 106 06/20/2013 1722   CO2 24 06/12/2023 2337   CO2 22 06/20/2013 1722   GLUCOSE 120 (H) 06/12/2023 2337   GLUCOSE 161 (H) 06/20/2013 1722   BUN 9 06/12/2023 2337   BUN 11 06/20/2013 1722   CREATININE 0.57 06/12/2023 2337   CREATININE 0.45 (L) 06/20/2013 1722   CALCIUM  9.1 06/12/2023 2337   CALCIUM  9.3 06/20/2013 1722   GFRNONAA >60 06/12/2023 2337   GFRNONAA >60 06/20/2013 1722   GFRAA >60 07/31/2019 1141   GFRAA >60 06/20/2013 1722   COAG Lab Results  Component Value Date   INR 1.07 03/18/2018   INR 1.01 03/17/2018   INR 0.95 03/16/2018     Disposition:  Discharge to :Home  Allergies as of 06/17/2023   No Known Allergies      Medication List     TAKE these  medications    aspirin  81 MG chewable tablet Chew 1 tablet (81 mg total) by mouth daily.   atorvastatin  40 MG tablet Commonly known as: LIPITOR Take 1 tablet (40 mg total) by mouth daily at 6 PM.   clopidogrel  75 MG tablet Commonly known as: PLAVIX  Take 1 tablet (75 mg total) by mouth daily.   gabapentin  300 MG capsule Commonly known as: NEURONTIN  Take 1 capsule (300 mg total) by mouth 2 (two) times daily.   lisinopril  5 MG tablet Commonly known as: ZESTRIL  Take 1 tablet (5 mg total) by mouth daily.   metoprolol  succinate 25 MG 24 hr tablet Commonly known as: Toprol  XL Take 1 tablet (25 mg total) by mouth daily.   nitroGLYCERIN  0.4 MG SL tablet Commonly known as: NITROSTAT  Place 1 tablet (0.4 mg total) under the tongue every 5 (five) minutes as needed for chest  pain.   oxyCODONE -acetaminophen  5-325 MG tablet Commonly known as: PERCOCET/ROXICET Take 1-2 tablets by mouth every 4 (four) hours as needed for moderate pain (pain score 4-6).   spironolactone  25 MG tablet Commonly known as: ALDACTONE  Take 1 tablet (25 mg total) by mouth daily.               Durable Medical Equipment  (From admission, onward)           Start     Ordered   06/14/23 1301  For home use only DME 3 n 1  Once        06/14/23 1300           Verbal and written Discharge instructions given to the patient. Wound care per Discharge AVS  Follow-up Information     Valene Gash, NP Follow up in 2 week(s).   Specialty: Vascular Surgery Why: Staple removal with U/S Left Lower extremity Contact information: [address] [place] [address] [phone]                 Signed: Annamaria Barrette, NP  06/17/2023, 10:27 AM

## 2023-06-17 NOTE — Discharge Instructions (Addendum)
 Vascular surgery instructions  Do not lift anything heavy for the next 2 weeks.  Do not lift anything more than a gallon of milk  Do not drive for 2 weeks until your staples are removed.  Keep incision site clean and dry.  You may shower using soap and water  but do not scrub staple line.  Pat completely dry after showering.  Paint staple line with Betadine sticks daily after showering.  You are being discharged on aspirin  81 mg daily, Plavix  75 mg daily and Lipitor 40 mg daily.  Do not skip or miss taking any of these medications as it will affect the outcome of your procedure/surgery.  Follow-up with vein and vascular surgery as scheduled.

## 2023-06-17 NOTE — TOC Transition Note (Signed)
 Transition of Care [facility]) - Discharge Note   Patient Details  Name: Brianna Roth MRN: [id number] Date of Birth: 12-09-1960  [facility] CM/SW Contact:  [name], LCSW Phone Number: 06/17/2023, 11:56 AM   Clinical Narrative:   Patient has orders to discharge home today. No further concerns. CSW signing off.  Final next level of care: Home/Self Care Barriers to Discharge: Barriers Resolved   Patient Goals and CMS Choice            Discharge Placement                    Patient and family notified of of transfer: 06/17/23  Discharge Plan and Services Additional resources added to the After Visit Summary for       Post Acute Care Choice: Durable Medical Equipment, Home Health          DME Arranged: 3-N-1 DME Agency: [employer] Date DME Agency Contacted: 06/14/23   Representative spoke with at DME Agency: Sam Creighton            Social Drivers of Health (SDOH) Interventions SDOH Screenings   Food Insecurity: No Food Insecurity (06/12/2023)  Housing: Low Risk  (06/12/2023)  Transportation Needs: No Transportation Needs (06/12/2023)  Utilities: Not At Risk (06/12/2023)  Financial Resource Strain: High Risk (03/08/2022)   Received from [facility], [facility]  Physical Activity: Inactive (03/08/2022)   Received from [facility], [facility]  Social Connections: Moderately Isolated (03/08/2022)   Received from [facility], [facility]  Stress: Stress Concern Present (03/08/2022)   Received from [facility], [facility]  Tobacco Use: High Risk (06/12/2023)     Readmission Risk Interventions     No data to display

## 2023-06-17 NOTE — Plan of Care (Signed)
  Problem: Education: Goal: Knowledge of General Education information will improve Description: Including pain rating scale, medication(s)/side effects and non-pharmacologic comfort measures Outcome: Completed/Met   Problem: Health Behavior/Discharge Planning: Goal: Ability to manage health-related needs will improve Outcome: Completed/Met   Problem: Clinical Measurements: Goal: Ability to maintain clinical measurements within normal limits will improve Outcome: Completed/Met Goal: Will remain free from infection Outcome: Completed/Met Goal: Diagnostic test results will improve Outcome: Completed/Met Goal: Respiratory complications will improve Outcome: Completed/Met Goal: Cardiovascular complication will be avoided Outcome: Completed/Met   Problem: Activity: Goal: Risk for activity intolerance will decrease Outcome: Completed/Met   Problem: Nutrition: Goal: Adequate nutrition will be maintained Outcome: Completed/Met   Problem: Coping: Goal: Level of anxiety will decrease Outcome: Completed/Met   Problem: Elimination: Goal: Will not experience complications related to bowel motility Outcome: Completed/Met Goal: Will not experience complications related to urinary retention Outcome: Completed/Met   Problem: Pain Managment: Goal: General experience of comfort will improve and/or be controlled Outcome: Completed/Met   Problem: Safety: Goal: Ability to remain free from injury will improve Outcome: Completed/Met   Problem: Skin Integrity: Goal: Risk for impaired skin integrity will decrease Outcome: Completed/Met   Problem: Education: Goal: Knowledge of prescribed regimen will improve Outcome: Completed/Met   Problem: Activity: Goal: Ability to tolerate increased activity will improve Outcome: Completed/Met   Problem: Bowel/Gastric: Goal: Gastrointestinal status for postoperative course will improve Outcome: Completed/Met   Problem: Clinical  Measurements: Goal: Postoperative complications will be avoided or minimized Outcome: Completed/Met Goal: Signs and symptoms of graft occlusion will improve Outcome: Completed/Met   Problem: Skin Integrity: Goal: Demonstration of wound healing without infection will improve Outcome: Completed/Met

## 2023-06-26 ENCOUNTER — Telehealth (INDEPENDENT_AMBULATORY_CARE_PROVIDER_SITE_OTHER): Payer: Self-pay | Admitting: Vascular Surgery

## 2023-06-26 NOTE — Telephone Encounter (Signed)
 Pt daughter called and stated after her moms procedure on 06/12/23 her left leg is very swollen and is forming discoloration. Pt daughter states that her leg is very swollen and tight. Pt daughter states that her moms leg feels like it is about to burst. Pt daughter stated that swelling started from toes and is now up to her thigh. Pt was advised if symptoms worsen to go to the emergency room.

## 2023-06-26 NOTE — Telephone Encounter (Signed)
 Patient daughter has sent pictures

## 2023-06-27 ENCOUNTER — Other Ambulatory Visit (INDEPENDENT_AMBULATORY_CARE_PROVIDER_SITE_OTHER): Payer: Self-pay | Admitting: Nurse Practitioner

## 2023-06-27 DIAGNOSIS — L819 Disorder of pigmentation, unspecified: Secondary | ICD-10-CM

## 2023-06-27 DIAGNOSIS — I739 Peripheral vascular disease, unspecified: Secondary | ICD-10-CM

## 2023-06-27 DIAGNOSIS — M7989 Other specified soft tissue disorders: Secondary | ICD-10-CM

## 2023-06-27 MED ORDER — SULFAMETHOXAZOLE-TRIMETHOPRIM 800-160 MG PO TABS: 1.0000 | ORAL_TABLET | Freq: Two times a day (BID) | ORAL | 0 refills | Status: AC

## 2023-06-27 NOTE — Telephone Encounter (Signed)
 I spoke with pt/daughter, and the swelling is not surprising.  The patient is advised to try to elevate her lower extremity is much as possible in addition to try compression to help with the swelling.  I was more concerned about the look of the groin itself.  It is reddened with some scabbing.  I have sent in antibiotics and we will have her come in on Monday for ultrasound and staple removal.

## 2023-07-01 ENCOUNTER — Encounter (INDEPENDENT_AMBULATORY_CARE_PROVIDER_SITE_OTHER): Payer: Self-pay | Admitting: Vascular Surgery

## 2023-07-01 ENCOUNTER — Ambulatory Visit (INDEPENDENT_AMBULATORY_CARE_PROVIDER_SITE_OTHER)

## 2023-07-01 ENCOUNTER — Ambulatory Visit (INDEPENDENT_AMBULATORY_CARE_PROVIDER_SITE_OTHER): Admitting: Vascular Surgery

## 2023-07-01 VITALS — BP 115/65 | HR 60 | Wt 126.0 lb

## 2023-07-01 DIAGNOSIS — Z9889 Other specified postprocedural states: Secondary | ICD-10-CM

## 2023-07-01 DIAGNOSIS — Z720 Tobacco use: Secondary | ICD-10-CM

## 2023-07-01 DIAGNOSIS — L819 Disorder of pigmentation, unspecified: Secondary | ICD-10-CM | POA: Diagnosis not present

## 2023-07-01 DIAGNOSIS — I739 Peripheral vascular disease, unspecified: Secondary | ICD-10-CM | POA: Diagnosis not present

## 2023-07-01 DIAGNOSIS — I10 Essential (primary) hypertension: Secondary | ICD-10-CM

## 2023-07-01 DIAGNOSIS — M7989 Other specified soft tissue disorders: Secondary | ICD-10-CM

## 2023-07-01 DIAGNOSIS — E785 Hyperlipidemia, unspecified: Secondary | ICD-10-CM

## 2023-07-01 DIAGNOSIS — I70213 Atherosclerosis of native arteries of extremities with intermittent claudication, bilateral legs: Secondary | ICD-10-CM

## 2023-07-01 NOTE — Progress Notes (Signed)
 Subjective:    Patient ID: Brianna Roth, female    DOB: 12/28/1960, 63 y.o.   MRN: [id number] Chief Complaint  Patient presents with   Follow-up    2 weeks (07/01/2023); Staple removal with U/S Left Lower extremity    Demecia Northway is a 63 year old female who presents to clinic today 3 weeks postop from a left femoral endarterectomy.  Her chief concern today was dehiscence of her left groin wound.  She called the office the middle of last week and was placed on 10 days of Bactrim  DS as coverage for a possible infection.  Patient endorses her leg is less swollen and her groin does not hurt as much today since starting antibiotics.  Patient underwent left lower extremity arterial duplex today to check blood flow due to the swelling.  Plan is to remove staples today.    Review of Systems  Constitutional: Negative.   HENT: Negative.    Eyes: Negative.   Respiratory: Negative.         History of asthma  Cardiovascular: Negative.   Gastrointestinal: Negative.   Genitourinary: Negative.   Musculoskeletal: Negative.   Skin:  Positive for wound.       Postoperative left femoral endarterectomy incision from 06/12/2023.  Neurological: Negative.        History of CVA.  Psychiatric/Behavioral: Negative.    All other systems reviewed and are negative.      Objective:    Physical Exam Vitals reviewed.  Constitutional:      Appearance: Normal appearance. She is normal weight.  HENT:     Head: Normocephalic.  Eyes:     Pupils: Pupils are equal, round, and reactive to light.  Cardiovascular:     Rate and Rhythm: Normal rate and regular rhythm.     Pulses: Normal pulses.     Heart sounds: Normal heart sounds.  Pulmonary:     Effort: Pulmonary effort is normal.     Breath sounds: Normal breath sounds.  Abdominal:     General: Abdomen is flat. Bowel sounds are normal.     Palpations: Abdomen is soft.  Musculoskeletal:        General: Tenderness present.     Cervical back: Normal  range of motion.     Left lower leg: Edema present.  Skin:    General: Skin is warm and dry.     Capillary Refill: Capillary refill takes 2 to 3 seconds.  Neurological:     General: No focal deficit present.     Mental Status: She is alert and oriented to person, place, and time. Mental status is at baseline.  Psychiatric:        Mood and Affect: Mood normal.        Behavior: Behavior normal.        Thought Content: Thought content normal.        Judgment: Judgment normal.     BP 115/65   Pulse 60   Wt 126 lb (57.2 kg)   BMI 22.32 kg/m   Past Medical History:  Diagnosis Date   Anginal pain (HCC)    Anxiety    Aortic atherosclerosis (HCC)    Asthma    Cancer (HCC)    Coronary artery disease 06/09/2008   a.) antlat STEMI 06/09/2008 -> LHC/PCI 06/10/2008: 100 mLAD (thrombectomy + 2.75 x 24mm Micro-driver BMS), 50 mRCA, 10 oLM, 30 RI, 90 D2; b.) inf STEMI 06/19/2013 - LHC/PCI 06/20/2013: 100 lat RI, 50 mRCA, 99 dRCA (Xience Alpine  DES); c.) inf STEMI 03/16/2018 -> LHC/PCI: 70 mRCA (2.75 x 15mm Resolute Onyx DES), 100 dRCA (overlapping 2.25 x 16mm + 2.25 x 18mm Resolute Onyx DES), 75 oRPDA, 30 p-mLAD   Depression    Dyspnea    Febrile seizures (during childhood)    HFrEF (heart failure with reduced ejection fraction) (HCC) 05/2008   a.) due to post-infarct ischemia cardiomyopathy; EF by LHC 25% on 06/10/2008 with anterior wall akinesis   History of heart artery stent    a.) TOTAL OF: 5 stents (LAD, m-dRCA) as of 06/11/2023   Hyperlipidemia    Hypertension    Ischemic cardiomyopathy 05/2008   a.) post-anterolateral infarct; EF 25% via LHC with anterior wall akinesis   Long term current use of clopidogrel     Long-term use of aspirin  therapy    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   PVD (peripheral vascular disease) (HCC)    ST elevation myocardial infarction (STEMI) of anterolateral wall (HCC) 06/09/2008   a.) anterolateral STEMI 06/09/2008 -->  delayed intervention at [facility] d/t mental status changes and concerns for CVA; MRI showed old CVAs; b.) LCH/PCI 06/10/2008: 100% mLAD (mechanical thrombectomy + 2.75 x 24 mm Micro-driver BMS), 16% mRCA, 10% oLM, 30% RI, 90% D2   ST elevation myocardial infarction (STEMI) of inferior wall (HCC) 03/16/2018   a.) LHC/PCI 03/16/2018: 70% mRCA (2.75 x 15 mm Resolute Onyx DES), 100% dRCA (overlapping 2.25 x 16 mm and 2.25 x 18 mm Resolute Oxyx DES), 75% oRPDA, 30% p-mLAD   ST elevation myocardial infarction (STEMI) of inferior wall (HCC) 06/19/2013   a.) LHC/PCI 06/20/2013: 99% dRCA (Xience Alpine DES)   Stroke (HCC)    a.) brain MRI 06/09/2008: areas of encephalomalacia involving the RIGHT frontal operculum and high RIGHT frontoparietal region c/w sequela of remote infarction   Tobacco abuse     Social History   Socioeconomic History   Marital status: Single    Spouse name: Not on file   Number of children: Not on file   Years of education: Not on file   Highest education level: Not on file  Occupational History   Not on file  Tobacco Use   Smoking status: Every Day    Current packs/day: 0.25    Average packs/day: 0.3 packs/day for 25.0 years (6.3 ttl pk-yrs)    Types: Cigarettes   Smokeless tobacco: Never  Vaping Use   Vaping status: Never Used  Substance and Sexual Activity   Alcohol use: No   Drug use: No   Sexual activity: Not on file  Other Topics Concern   Not on file  Social History Narrative   Lives with daughter   Social Drivers of Health   Financial Resource Strain: High Risk (03/08/2022)   Received from [facility], [facility]   Overall Financial Resource Strain (CARDIA)    Difficulty of Paying Living Expenses: Hard  Food Insecurity: No Food Insecurity (06/12/2023)   Hunger Vital Sign    Worried About Running Out of Food in the Last Year: Never true    Ran Out of Food in the Last Year: Never true  Transportation Needs: No  Transportation Needs (06/12/2023)   PRAPARE - Administrator, Civil Service (Medical): No    Lack of Transportation (Non-Medical): No  Physical Activity: Inactive (03/08/2022)   Received from [facility], [facility]   Exercise Vital Sign    Days of Exercise per Week: 0  days    Minutes of Exercise per Session: 0 min  Stress: Stress Concern Present (03/08/2022)   Received from [facility], [facility]   [employer] of Occupational Health - Occupational Stress Questionnaire    Feeling of Stress : Rather much  Social Connections: Moderately Isolated (03/08/2022)   Received from [facility], [facility]   Social Connection and Isolation Panel [NHANES]    Frequency of Communication with Friends and Family: More than three times a week    Frequency of Social Gatherings with Friends and Family: More than three times a week    Attends Religious Services: Never    Database administrator or Organizations: No    Attends Banker Meetings: Never    Marital Status: Living with partner  Intimate Partner Violence: Not At Risk (06/12/2023)   Humiliation, Afraid, Rape, and Kick questionnaire    Fear of Current or Ex-Partner: No    Emotionally Abused: No    Physically Abused: No    Sexually Abused: No    Past Surgical History:  Procedure Laterality Date   ABDOMINAL HYSTERECTOMY     APPLICATION OF CELL SAVER N/A 06/12/2023   Procedure: APPLICATION OF CELL SAVER;  Surgeon: Celso College, MD;  Location: [facility] ORS;  Service: Vascular;  Laterality: N/A;   BACK SURGERY     CORONARY ANGIOPLASTY     CORONARY ANGIOPLASTY WITH STENT PLACEMENT Left 06/10/2008   Procedure: CORONARY ANGIOPLASTY WITH STENT PLACEMENT; Location: [facility]   CORONARY ANGIOPLASTY WITH STENT PLACEMENT Left 06/20/2013   Procedure: CORONARY ANGIOPLASTY WITH STENT PLACEMENT; Location: [facility]   CORONARY/GRAFT  ACUTE MI REVASCULARIZATION N/A 03/16/2018   Procedure: Coronary/Graft Acute MI Revascularization;  Surgeon: Percival Brace, MD;  Location: [facility] INVASIVE CV [facility];  Service: Cardiovascular;  Laterality: N/A;   ENDARTERECTOMY FEMORAL Left 06/12/2023   Procedure: ENDARTERECTOMY, FEMORAL;  Surgeon: Celso College, MD;  Location: [facility];  Service: Vascular;  Laterality: Left;   LEFT HEART CATH AND CORONARY ANGIOGRAPHY N/A 03/16/2018   Procedure: LEFT HEART CATH AND CORONARY ANGIOGRAPHY;  Surgeon: Percival Brace, MD;  Location: [facility] CV [facility];  Service: Cardiovascular;  Laterality: N/A;   LOWER EXTREMITY ANGIOGRAPHY Left 05/13/2023   Procedure: Lower Extremity Angiography;  Surgeon: Celso College, MD;  Location: [facility] CV [facility];  Service: Cardiovascular;  Laterality: Left;   THROAT SURGERY     THYROIDECTOMY      Family History  Problem Relation Age of Onset   Cancer Mother    Heart attack Father    Hypertension Daughter    Heart attack Daughter     No Known Allergies     Latest Ref Rng & Units 06/16/2023   11:34 AM 06/15/2023    4:42 AM 06/12/2023   11:37 PM  CBC  WBC 4.0 - 10.5 K/uL 9.2  8.0  7.3   Hemoglobin 12.0 - 15.0 g/dL 16.1  09.6  04.5   Hematocrit 36.0 - 46.0 % 44.8  41.3  37.8   Platelets 150 - 400 K/uL 185  168  149        CMP     Component Value Date/Time   NA 137 06/12/2023 2337   NA 138 06/20/2013 1722   K 4.6 06/12/2023 2337   K 4.7 06/20/2013 1722   CL 107 06/12/2023 2337   CL 106 06/20/2013 1722   CO2 24 06/12/2023 2337   CO2 22 06/20/2013 1722   GLUCOSE 120 (  H) 06/12/2023 2337   GLUCOSE 161 (H) 06/20/2013 1722   BUN 9 06/12/2023 2337   BUN 11 06/20/2013 1722   CREATININE 0.57 06/12/2023 2337   CREATININE 0.45 (L) 06/20/2013 1722   CALCIUM  9.1 06/12/2023 2337   CALCIUM  9.3 06/20/2013 1722   PROT 8.0 07/31/2019 1141   PROT 8.3 (H) 06/20/2013 1722   ALBUMIN 3.8 07/31/2019 1141   ALBUMIN 3.7 06/20/2013 1722   AST 15 07/31/2019 1141    AST 28 06/20/2013 1722   ALT 12 07/31/2019 1141   ALT 16 06/20/2013 1722   ALKPHOS 98 07/31/2019 1141   ALKPHOS 128 (H) 06/20/2013 1722   BILITOT 1.3 (H) 07/31/2019 1141   BILITOT 0.5 06/20/2013 1722   GFRNONAA >60 06/12/2023 2337   GFRNONAA >60 06/20/2013 1722     VAS US  ABI WITH/WO TBI Result Date: 05/29/2023  LOWER EXTREMITY DOPPLER STUDY Patient Name:  Tysheka Fanguy [place]  Date of Exam:   05/28/2023 Medical Rec #: [id number]           Accession #:    [id number] Date of Birth: [birth year]           Patient Gender: F Patient Age:   40 years Exam Location:  [facility] Procedure:      VAS US  ABI WITH/WO TBI Referring Phys: Mikki Alexander --------------------------------------------------------------------------------  Indications: Claudication, rest pain, and peripheral artery disease. High Risk Factors: Hypertension, coronary artery disease.  Vascular Interventions: 05/13/2023: Aortogram and Selective Left Lower Extremity                         Angiogram. Lifestream stent placement to the Right CIA                         with 9 mm diameter by 38 mm length Lifestream stent                         postdilated to 10 mm. Comparison Study: 05/01/2023 Performing Technologist: Tonie Franks RVS  Examination Guidelines: A complete evaluation includes at minimum, Doppler waveform signals and systolic blood pressure reading at the level of bilateral brachial, anterior tibial, and posterior tibial arteries, when vessel segments are accessible. Bilateral testing is considered an integral part of a complete examination. Photoelectric Plethysmograph (PPG) waveforms and toe systolic pressure readings are included as required and additional duplex testing as needed. Limited examinations for reoccurring indications may be performed as noted.  ABI Findings: +---------+------------------+-----+---------+--------+ Right    Rt Pressure (mmHg)IndexWaveform Comment   +---------+------------------+-----+---------+--------+ Brachial 129                                      +---------+------------------+-----+---------+--------+ ATA      132               1.02 triphasic         +---------+------------------+-----+---------+--------+ PTA      141               1.08 triphasic         +---------+------------------+-----+---------+--------+ Great Toe217               1.67 Normal   [place]       +---------+------------------+-----+---------+--------+ +---------+------------------+-----+--------+-------+ Left     Lt Pressure (mmHg)IndexWaveformComment +---------+------------------+-----+--------+-------+ Brachial 130                                    +---------+------------------+-----+--------+-------+  ATA      105               0.81 biphasic        +---------+------------------+-----+--------+-------+ PTA      98                0.75 biphasic        +---------+------------------+-----+--------+-------+ Great Toe99                0.76 Normal          +---------+------------------+-----+--------+-------+ +-------+-----------+-----------+------------+------------+ ABI/TBIToday's ABIToday's TBIPrevious ABIPrevious TBI +-------+-----------+-----------+------------+------------+ Right  1.08       1.67 [place]    .57         .34          +-------+-----------+-----------+------------+------------+ Left   .81        .76        .59         .25          +-------+-----------+-----------+------------+------------+ Bilateral ABIs appear increased compared to prior study on 05/01/2023. Bilateral TBIs appear increased compared to prior study on 05/01/2023.  Summary: Right: Resting right ankle-brachial index is within normal range. The right toe-brachial index is normal. Left: Resting left ankle-brachial index indicates mild left lower extremity arterial disease. The left toe-brachial index is normal. *See table(s) above for measurements and  observations.  Electronically signed by Mikki Alexander MD on 05/29/2023 at 10:26:33 AM.    Final        Assessment & Plan:   1. Atherosclerosis of native artery of both lower extremities with intermittent claudication (HCC) (Primary) Patient is now 3 weeks postop from left femoral endarterectomy.  She presents today for 3-week follow-up and staple removal.  Patient called clinic the end of last week for concerns of her left groin wound not healing.  She was placed on Bactrim  DS for 10 days 3 days ago.  Today her left groin staples were removed as her wound is well-healed with 1 area at the distal end of the wound now scabbing.  I placed a dressing on the wound area to keep it dry and instructed the patient and a family member to continue to do so until her antibiotics have completed.  Will see the patient in 2 weeks in follow-up to recheck the wound.  Patient underwent left lower extremity arterial duplex ultrasound today.  All waveforms are triphasic in all pressures.  Patient does have +1 edema to her left lower extremity and she was instructed and counseled to rest elevate and exercise as it appears she is having some reperfusion syndrome.  2. Essential hypertension Continue antihypertensive medications as already ordered, these medications have been reviewed and there are no changes at this time.  3. Hyperlipidemia, unspecified hyperlipidemia type Continue statin as ordered and reviewed, no changes at this time  4. Tobacco abuse Smoking cessation was discussed, 3-10 minutes spent on this topic specifically  Represents a significant atherosclerotic risk factor and cessation would be of benefit long-term for patency of any surgery or intervention.    Current Outpatient Medications on File Prior to Visit  Medication Sig Dispense Refill   aspirin  81 MG chewable tablet Chew 1 tablet (81 mg total) by mouth daily. 180 tablet 0   atorvastatin  (LIPITOR) 40 MG tablet Take 1 tablet (40 mg total) by  mouth daily at 6 PM. 90 tablet 3   clopidogrel  (PLAVIX ) 75 MG tablet Take 1 tablet (75 mg total) by mouth daily. 90 tablet 3  gabapentin  (NEURONTIN ) 300 MG capsule Take 1 capsule (300 mg total) by mouth 2 (two) times daily. 60 capsule 0   isosorbide mononitrate (IMDUR) 30 MG 24 hr tablet Take 30 mg by mouth daily.     lisinopril  (PRINIVIL ,ZESTRIL ) 5 MG tablet Take 1 tablet (5 mg total) by mouth daily. 180 tablet 0   losartan (COZAAR) 25 MG tablet Take 25 mg by mouth daily.     metoprolol  succinate (TOPROL  XL) 25 MG 24 hr tablet Take 1 tablet (25 mg total) by mouth daily. 180 tablet 0   nitroGLYCERIN  (NITROSTAT ) 0.4 MG SL tablet Place 1 tablet (0.4 mg total) under the tongue every 5 (five) minutes as needed for chest pain. 20 tablet 0   oxyCODONE -acetaminophen  (PERCOCET/ROXICET) 5-325 MG tablet Take 1-2 tablets by mouth every 4 (four) hours as needed for moderate pain (pain score 4-6). 20 tablet 0   spironolactone  (ALDACTONE ) 25 MG tablet Take 1 tablet (25 mg total) by mouth daily. 30 tablet 0   sulfamethoxazole -trimethoprim  (BACTRIM  DS) 800-160 MG tablet Take 1 tablet by mouth 2 (two) times daily. 20 tablet 0   No current facility-administered medications on file prior to visit.    There are no Patient Instructions on file for this visit. No follow-ups on file.   Annamaria Barrette, NP

## 2023-07-15 ENCOUNTER — Encounter (INDEPENDENT_AMBULATORY_CARE_PROVIDER_SITE_OTHER): Payer: Self-pay | Admitting: Vascular Surgery

## 2023-07-15 ENCOUNTER — Ambulatory Visit (INDEPENDENT_AMBULATORY_CARE_PROVIDER_SITE_OTHER): Admitting: Vascular Surgery

## 2023-07-15 VITALS — BP 116/73 | HR 60 | Resp 16 | Wt 128.6 lb

## 2023-07-15 DIAGNOSIS — Z9889 Other specified postprocedural states: Secondary | ICD-10-CM

## 2023-07-15 DIAGNOSIS — I739 Peripheral vascular disease, unspecified: Secondary | ICD-10-CM

## 2023-07-15 DIAGNOSIS — I10 Essential (primary) hypertension: Secondary | ICD-10-CM

## 2023-07-15 DIAGNOSIS — Z720 Tobacco use: Secondary | ICD-10-CM

## 2023-07-15 MED ORDER — OXYCODONE-ACETAMINOPHEN 5-325 MG PO TABS: 1.0000 | ORAL_TABLET | ORAL | 0 refills | Status: AC | PRN

## 2023-07-15 NOTE — Progress Notes (Signed)
 Subjective:    Patient ID: Brianna Roth, female    DOB: [birth year], 63 y.o.   MRN: [id number] Chief Complaint  Patient presents with   Follow-up    2 week follow up    Brianna Roth is a 63 year old female who presents to clinic today 4 weeks postop from a left femoral endarterectomy.  She is here today for another wound check. She endorses some scabbing to the left groin incision but feels it is healing well.  She called the office two weeks ago and was placed on 10 days of Bactrim  DS as coverage for a possible infection.  Patient endorses her leg is less swollen and her groin does not hurt as much today since starting antibiotics. Patient endorses continued pain to her leg. No other complaints today. She endorses continuing to smoke.     Review of Systems  Constitutional: Negative.   HENT: Negative.    Respiratory: Negative.    Cardiovascular:  Positive for leg swelling.  Gastrointestinal: Negative.   Genitourinary: Negative.   Musculoskeletal: Negative.        Objective:    Physical Exam Constitutional:      Appearance: Normal appearance. She is normal weight.  HENT:     Head: Normocephalic.  Eyes:     Pupils: Pupils are equal, round, and reactive to light.  Cardiovascular:     Rate and Rhythm: Normal rate and regular rhythm.     Pulses: Normal pulses.     Heart sounds: Normal heart sounds.  Pulmonary:     Effort: Pulmonary effort is normal.     Breath sounds: Normal breath sounds.  Abdominal:     General: Abdomen is flat. Bowel sounds are normal.     Palpations: Abdomen is soft.  Musculoskeletal:     Right lower leg: Edema present.  Skin:    General: Skin is warm and dry.     Capillary Refill: Capillary refill takes 2 to 3 seconds.  Neurological:     General: No focal deficit present.     Mental Status: She is alert and oriented to person, place, and time. Mental status is at baseline.  Psychiatric:        Mood and Affect: Mood normal.        Behavior:  Behavior normal.        Thought Content: Thought content normal.        Judgment: Judgment normal.     BP 116/73   Pulse 60   Resp 16   Wt 128 lb 9.6 oz (58.3 kg)   BMI 22.78 kg/m   Past Medical History:  Diagnosis Date   Anginal pain (HCC)    Anxiety    Aortic atherosclerosis (HCC)    Asthma    Cancer (HCC)    Coronary artery disease 06/09/2008   a.) antlat STEMI 06/09/2008 -> LHC/PCI 06/10/2008: 100 mLAD (thrombectomy + 2.75 x 24mm Micro-driver BMS), 50 mRCA, 10 oLM, 30 RI, 90 D2; b.) inf STEMI 06/19/2013 - LHC/PCI 06/20/2013: 100 lat RI, 50 mRCA, 99 dRCA (Xience Alpine DES); c.) inf STEMI 03/16/2018 -> LHC/PCI: 70 mRCA (2.75 x 15mm Resolute Onyx DES), 100 dRCA (overlapping 2.25 x 16mm + 2.25 x 18mm Resolute Onyx DES), 75 oRPDA, 30 p-mLAD   Depression    Dyspnea    Febrile seizures (during childhood)    HFrEF (heart failure with reduced ejection fraction) (HCC) 05/2008   a.) due to post-infarct ischemia cardiomyopathy; EF by LHC 25% on 06/10/2008 with anterior  wall akinesis   History of heart artery stent    a.) TOTAL OF: 5 stents (LAD, m-dRCA) as of 06/11/2023   Hyperlipidemia    Hypertension    Ischemic cardiomyopathy 05/2008   a.) post-anterolateral infarct; EF 25% via LHC with anterior wall akinesis   Long term current use of clopidogrel     Long-term use of aspirin  therapy    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   PVD (peripheral vascular disease) (HCC)    ST elevation myocardial infarction (STEMI) of anterolateral wall (HCC) 06/09/2008   a.) anterolateral STEMI 06/09/2008 --> delayed intervention at [facility] d/t mental status changes and concerns for CVA; MRI showed old CVAs; b.) LCH/PCI 06/10/2008: 100% mLAD (mechanical thrombectomy + 2.75 x 24 mm Micro-driver BMS), 91% mRCA, 47% oLM, 30% RI, 90% D2   ST elevation myocardial infarction (STEMI) of inferior wall (HCC) 03/16/2018   a.) LHC/PCI 03/16/2018: 70% mRCA (2.75 x 15 mm Resolute Onyx  DES), 100% dRCA (overlapping 2.25 x 16 mm and 2.25 x 18 mm Resolute Oxyx DES), 75% oRPDA, 30% p-mLAD   ST elevation myocardial infarction (STEMI) of inferior wall (HCC) 06/19/2013   a.) LHC/PCI 06/20/2013: 99% dRCA (Xience Alpine DES)   Stroke (HCC)    a.) brain MRI 06/09/2008: areas of encephalomalacia involving the RIGHT frontal operculum and high RIGHT frontoparietal region c/w sequela of remote infarction   Tobacco abuse     Social History   Socioeconomic History   Marital status: Single    Spouse name: Not on file   Number of children: Not on file   Years of education: Not on file   Highest education level: Not on file  Occupational History   Not on file  Tobacco Use   Smoking status: Every Day    Current packs/day: 0.25    Average packs/day: 0.3 packs/day for 25.0 years (6.3 ttl pk-yrs)    Types: Cigarettes   Smokeless tobacco: Never  Vaping Use   Vaping status: Never Used  Substance and Sexual Activity   Alcohol use: No   Drug use: No   Sexual activity: Not on file  Other Topics Concern   Not on file  Social History Narrative   Lives with daughter   Social Drivers of Health   Financial Resource Strain: High Risk (03/08/2022)   Received from [facility], [facility]   Overall Financial Resource Strain (CARDIA)    Difficulty of Paying Living Expenses: Hard  Food Insecurity: No Food Insecurity (06/12/2023)   Hunger Vital Sign    Worried About Running Out of Food in the Last Year: Never true    Ran Out of Food in the Last Year: Never true  Transportation Needs: No Transportation Needs (06/12/2023)   PRAPARE - Administrator, Civil Service (Medical): No    Lack of Transportation (Non-Medical): No  Physical Activity: Inactive (03/08/2022)   Received from [facility], [facility]   Exercise Vital Sign    Days of Exercise per Week: 0 days    Minutes of Exercise per Session: 0 min   Stress: Stress Concern Present (03/08/2022)   Received from [facility], [facility]   [employer] of Occupational Health - Occupational Stress Questionnaire    Feeling of Stress : Rather much  Social Connections: Moderately Isolated (03/08/2022)   Received from [facility], [facility]   Social Connection and Isolation Panel [  NHANES]    Frequency of Communication with Friends and Family: More than three times a week    Frequency of Social Gatherings with Friends and Family: More than three times a week    Attends Religious Services: Never    Database administrator or Organizations: No    Attends Banker Meetings: Never    Marital Status: Living with partner  Intimate Partner Violence: Not At Risk (06/12/2023)   Humiliation, Afraid, Rape, and Kick questionnaire    Fear of Current or Ex-Partner: No    Emotionally Abused: No    Physically Abused: No    Sexually Abused: No    Past Surgical History:  Procedure Laterality Date   ABDOMINAL HYSTERECTOMY     APPLICATION OF CELL SAVER N/A 06/12/2023   Procedure: APPLICATION OF CELL SAVER;  Surgeon: Celso College, MD;  Location: [facility] ORS;  Service: Vascular;  Laterality: N/A;   BACK SURGERY     CORONARY ANGIOPLASTY     CORONARY ANGIOPLASTY WITH STENT PLACEMENT Left 06/10/2008   Procedure: CORONARY ANGIOPLASTY WITH STENT PLACEMENT; Location: [facility]   CORONARY ANGIOPLASTY WITH STENT PLACEMENT Left 06/20/2013   Procedure: CORONARY ANGIOPLASTY WITH STENT PLACEMENT; Location: [facility]   CORONARY/GRAFT ACUTE MI REVASCULARIZATION N/A 03/16/2018   Procedure: Coronary/Graft Acute MI Revascularization;  Surgeon: Percival Brace, MD;  Location: [facility] INVASIVE CV LAB;  Service: Cardiovascular;  Laterality: N/A;   ENDARTERECTOMY FEMORAL Left 06/12/2023   Procedure: ENDARTERECTOMY, FEMORAL;  Surgeon: Celso College, MD;  Location: [facility] OR[facility];  Service: Vascular;  Laterality:  Left;   LEFT HEART CATH AND CORONARY ANGIOGRAPHY N/A 03/16/2018   Procedure: LEFT HEART CATH AND CORONARY ANGIOGRAPHY;  Surgeon: Percival Brace, MD;  Location: [facility] INVASIVE CV LAB;  Service: Cardiovascular;  Laterality: N/A;   LOWER EXTREMITY ANGIOGRAPHY Left 05/13/2023   Procedure: Lower Extremity Angiography;  Surgeon: Celso College, MD;  Location: [facility] INVASIVE CV LAB;  Service: Cardiovascular;  Laterality: Left;   THROAT SURGERY     THYROIDECTOMY      Family History  Problem Relation Age of Onset   Cancer Mother    Heart attack Father    Hypertension Daughter    Heart attack Daughter     No Known Allergies     Latest Ref Rng & Units 06/16/2023   11:34 AM 06/15/2023    4:42 AM 06/12/2023   11:37 PM  CBC  WBC 4.0 - 10.5 K/uL 9.2  8.0  7.3   Hemoglobin 12.0 - 15.0 g/dL 16.1  09.6  04.5   Hematocrit 36.0 - 46.0 % 44.8  41.3  37.8   Platelets 150 - 400 K/uL 185  168  149        CMP     Component Value Date/Time   NA 137 06/12/2023 2337   NA 138 06/20/2013 1722   K 4.6 06/12/2023 2337   K 4.7 06/20/2013 1722   CL 107 06/12/2023 2337   CL 106 06/20/2013 1722   CO2 24 06/12/2023 2337   CO2 22 06/20/2013 1722   GLUCOSE 120 (H) 06/12/2023 2337   GLUCOSE 161 (H) 06/20/2013 1722   BUN 9 06/12/2023 2337   BUN 11 06/20/2013 1722   CREATININE 0.57 06/12/2023 2337   CREATININE 0.45 (L) 06/20/2013 1722   CALCIUM  9.1 06/12/2023 2337   CALCIUM  9.3 06/20/2013 1722   PROT 8.0 07/31/2019 1141   PROT 8.3 (H) 06/20/2013 1722   ALBUMIN 3.8 07/31/2019 1141   ALBUMIN 3.7 06/20/2013 1722  AST 15 07/31/2019 1141   AST 28 06/20/2013 1722   ALT 12 07/31/2019 1141   ALT 16 06/20/2013 1722   ALKPHOS 98 07/31/2019 1141   ALKPHOS 128 (H) 06/20/2013 1722   BILITOT 1.3 (H) 07/31/2019 1141   BILITOT 0.5 06/20/2013 1722   GFRNONAA >60 06/12/2023 2337   GFRNONAA >60 06/20/2013 1722     VAS US  ABI WITH/WO TBI Result Date: 05/29/2023  LOWER EXTREMITY DOPPLER STUDY Patient Name:   Brianna Roth  Date of Exam:   05/28/2023 Medical Rec #: [id number]           Accession #:    [id number] Date of Birth: 1960/09/18           Patient Gender: F Patient Age:   39 years Exam Location:  [facility] Procedure:      VAS US  ABI WITH/WO TBI Referring Phys: Mikki Alexander --------------------------------------------------------------------------------  Indications: Claudication, rest pain, and peripheral artery disease. High Risk Factors: Hypertension, coronary artery disease.  Vascular Interventions: 05/13/2023: Aortogram and Selective Left Lower Extremity                         Angiogram. Lifestream stent placement to the Right CIA                         with 9 mm diameter by 38 mm length Lifestream stent                         postdilated to 10 mm. Comparison Study: 05/01/2023 Performing Technologist: Tonie Franks RVS  Examination Guidelines: A complete evaluation includes at minimum, Doppler waveform signals and systolic blood pressure reading at the level of bilateral brachial, anterior tibial, and posterior tibial arteries, when vessel segments are accessible. Bilateral testing is considered an integral part of a complete examination. Photoelectric Plethysmograph (PPG) waveforms and toe systolic pressure readings are included as required and additional duplex testing as needed. Limited examinations for reoccurring indications may be performed as noted.  ABI Findings: +---------+------------------+-----+---------+--------+ Right    Rt Pressure (mmHg)IndexWaveform Comment  +---------+------------------+-----+---------+--------+ Brachial 129                                      +---------+------------------+-----+---------+--------+ ATA      132               1.02 triphasic         +---------+------------------+-----+---------+--------+ PTA      141               1.08 triphasic         +---------+------------------+-----+---------+--------+ Great Toe217                1.67 Normal   [place]       +---------+------------------+-----+---------+--------+ +---------+------------------+-----+--------+-------+ Left     Lt Pressure (mmHg)IndexWaveformComment +---------+------------------+-----+--------+-------+ Brachial 130                                    +---------+------------------+-----+--------+-------+ ATA      105               0.81 biphasic        +---------+------------------+-----+--------+-------+ PTA      98  0.75 biphasic        +---------+------------------+-----+--------+-------+ Great Toe99                0.76 Normal          +---------+------------------+-----+--------+-------+ +-------+-----------+-----------+------------+------------+ ABI/TBIToday's ABIToday's TBIPrevious ABIPrevious TBI +-------+-----------+-----------+------------+------------+ Right  1.08       1.67 [place]    .57         .34          +-------+-----------+-----------+------------+------------+ Left   .81        .76        .59         .25          +-------+-----------+-----------+------------+------------+ Bilateral ABIs appear increased compared to prior study on 05/01/2023. Bilateral TBIs appear increased compared to prior study on 05/01/2023.  Summary: Right: Resting right ankle-brachial index is within normal range. The right toe-brachial index is normal. Left: Resting left ankle-brachial index indicates mild left lower extremity arterial disease. The left toe-brachial index is normal. *See table(s) above for measurements and observations.  Electronically signed by Mikki Alexander MD on 05/29/2023 at 10:26:33 AM.    Final        Assessment & Plan:   1. Peripheral arterial disease with history of revascularization (HCC) (Primary) Patient is now 1 month postop from left common femoral, profunda femoris, and superficial femoral artery endarterectomies with patch angioplasties.  She returns today for 2-week follow-up for recheck of her  left groin and incision wound.  This is healing very well.  It does have some scabbing but no signs or symptoms of infection, hematoma, or seroma.  She does still continue to have +1 edema to her left lower extremity.  I instructed her to rest, elevate and exercise as she continues to have some reperfusion syndrome with some pain.  Pain medications were reordered to help her at this time.  Patient will follow-up in 2 months with bilateral lower extremity arterial ultrasounds with ABIs.  2. Essential hypertension Continue antihypertensive medications as already ordered, these medications have been reviewed and there are no changes at this time.  3. Tobacco abuse Smoking cessation was discussed, 3-10 minutes spent on this topic specifically.  Patient continues to smoke about half pack a day.  I encouraged her to stop smoking.  She states she is working on it but refuses any other treatment today.   Current Outpatient Medications on File Prior to Visit  Medication Sig Dispense Refill   aspirin  81 MG chewable tablet Chew 1 tablet (81 mg total) by mouth daily. 180 tablet 0   atorvastatin  (LIPITOR) 40 MG tablet Take 1 tablet (40 mg total) by mouth daily at 6 PM. 90 tablet 3   clopidogrel  (PLAVIX ) 75 MG tablet Take 1 tablet (75 mg total) by mouth daily. 90 tablet 3   gabapentin  (NEURONTIN ) 300 MG capsule Take 1 capsule (300 mg total) by mouth 2 (two) times daily. 60 capsule 0   isosorbide mononitrate (IMDUR) 30 MG 24 hr tablet Take 30 mg by mouth daily.     lisinopril  (PRINIVIL ,ZESTRIL ) 5 MG tablet Take 1 tablet (5 mg total) by mouth daily. 180 tablet 0   losartan (COZAAR) 25 MG tablet Take 25 mg by mouth daily.     metoprolol  succinate (TOPROL  XL) 25 MG 24 hr tablet Take 1 tablet (25 mg total) by mouth daily. 180 tablet 0   nitroGLYCERIN  (NITROSTAT ) 0.4 MG SL tablet Place 1 tablet (0.4 mg total) under the tongue every 5 (five) minutes  as needed for chest pain. 20 tablet 0   spironolactone  (ALDACTONE )  25 MG tablet Take 1 tablet (25 mg total) by mouth daily. 30 tablet 0   sulfamethoxazole -trimethoprim  (BACTRIM  DS) 800-160 MG tablet Take 1 tablet by mouth 2 (two) times daily. (Patient not taking: Reported on 07/15/2023) 20 tablet 0   No current facility-administered medications on file prior to visit.    There are no Patient Instructions on file for this visit. No follow-ups on file.   Annamaria Barrette, NP

## 2023-07-16 ENCOUNTER — Encounter (INDEPENDENT_AMBULATORY_CARE_PROVIDER_SITE_OTHER): Payer: Self-pay | Admitting: Vascular Surgery

## 2023-09-10 ENCOUNTER — Other Ambulatory Visit (INDEPENDENT_AMBULATORY_CARE_PROVIDER_SITE_OTHER): Payer: Self-pay | Admitting: Vascular Surgery

## 2023-09-10 DIAGNOSIS — I739 Peripheral vascular disease, unspecified: Secondary | ICD-10-CM

## 2023-09-13 ENCOUNTER — Ambulatory Visit (INDEPENDENT_AMBULATORY_CARE_PROVIDER_SITE_OTHER)

## 2023-09-13 ENCOUNTER — Other Ambulatory Visit (INDEPENDENT_AMBULATORY_CARE_PROVIDER_SITE_OTHER)

## 2023-09-13 ENCOUNTER — Encounter (INDEPENDENT_AMBULATORY_CARE_PROVIDER_SITE_OTHER): Payer: Self-pay | Admitting: Vascular Surgery

## 2023-09-13 ENCOUNTER — Ambulatory Visit (INDEPENDENT_AMBULATORY_CARE_PROVIDER_SITE_OTHER): Admitting: Vascular Surgery

## 2023-09-13 VITALS — BP 146/75 | HR 53 | Resp 18 | Wt 134.8 lb

## 2023-09-13 DIAGNOSIS — Z9889 Other specified postprocedural states: Secondary | ICD-10-CM

## 2023-09-13 DIAGNOSIS — I739 Peripheral vascular disease, unspecified: Secondary | ICD-10-CM

## 2023-09-13 DIAGNOSIS — I10 Essential (primary) hypertension: Secondary | ICD-10-CM

## 2023-09-13 DIAGNOSIS — Z720 Tobacco use: Secondary | ICD-10-CM

## 2023-09-13 DIAGNOSIS — E785 Hyperlipidemia, unspecified: Secondary | ICD-10-CM

## 2023-09-13 NOTE — Progress Notes (Signed)
 Subjective:    Patient ID: Brianna Roth, female    DOB: 1960-12-08, 63 y.o.   MRN: [id number] No chief complaint on file.   Brianna Roth is a 63 year old female who presents to clinic today 4 weeks postop from a left femoral endarterectomy.  She is here today for another wound check. She endorses some scabbing to the left groin incision but feels it is healing well.  She called the office two weeks ago and was placed on 10 days of Bactrim  DS as coverage for a possible infection.  Patient endorses her leg is less swollen and her groin does not hurt as much today since starting antibiotics. Patient endorses continued pain to her leg. No other complaints today. She endorses continuing to smoke.        Review of Systems  Constitutional: Negative.   Cardiovascular:  Positive for leg swelling.  Skin:  Positive for wound.       Left groin postoperative femoral endarterectomy.  All other systems reviewed and are negative.      Objective:   Physical Exam Vitals reviewed.  Constitutional:      Appearance: Normal appearance. She is normal weight.  HENT:     Head: Normocephalic.  Eyes:     Pupils: Pupils are equal, round, and reactive to light.  Cardiovascular:     Rate and Rhythm: Normal rate and regular rhythm.     Pulses: Normal pulses.     Heart sounds: Normal heart sounds.  Pulmonary:     Effort: Pulmonary effort is normal.     Comments: Coarse breath sounds as the patient continues to smoke Abdominal:     General: Bowel sounds are normal.     Palpations: Abdomen is soft.  Musculoskeletal:        General: Tenderness present.     Left lower leg: Edema present.  Skin:    General: Skin is warm and dry.     Capillary Refill: Capillary refill takes 2 to 3 seconds.  Neurological:     General: No focal deficit present.     Mental Status: She is alert and oriented to person, place, and time. Mental status is at baseline.  Psychiatric:        Mood and Affect: Mood normal.         Behavior: Behavior normal.        Thought Content: Thought content normal.        Judgment: Judgment normal.     There were no vitals taken for this visit.  Past Medical History:  Diagnosis Date   Anginal pain (HCC)    Anxiety    Aortic atherosclerosis (HCC)    Asthma    Cancer (HCC)    Coronary artery disease 06/09/2008   a.) antlat STEMI 06/09/2008 -> LHC/PCI 06/10/2008: 100 mLAD (thrombectomy + 2.75 x 24mm Micro-driver BMS), 50 mRCA, 10 oLM, 30 RI, 90 D2; b.) inf STEMI 06/19/2013 - LHC/PCI 06/20/2013: 100 lat RI, 50 mRCA, 99 dRCA (Xience Alpine DES); c.) inf STEMI 03/16/2018 -> LHC/PCI: 70 mRCA (2.75 x 15mm Resolute Onyx DES), 100 dRCA (overlapping 2.25 x 16mm + 2.25 x 18mm Resolute Onyx DES), 75 oRPDA, 30 p-mLAD   Depression    Dyspnea    Febrile seizures (during childhood)    HFrEF (heart failure with reduced ejection fraction) (HCC) 05/2008   a.) due to post-infarct ischemia cardiomyopathy; EF by LHC 25% on 06/10/2008 with anterior wall akinesis   History of heart artery stent  a.) TOTAL OF: 5 stents (LAD, m-dRCA) as of 06/11/2023   Hyperlipidemia    Hypertension    Ischemic cardiomyopathy 05/2008   a.) post-anterolateral infarct; EF 25% via LHC with anterior wall akinesis   Long term current use of clopidogrel     Long-term use of aspirin  therapy    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   PVD (peripheral vascular disease) (HCC)    ST elevation myocardial infarction (STEMI) of anterolateral wall (HCC) 06/09/2008   a.) anterolateral STEMI 06/09/2008 --> delayed intervention at [facility] d/t mental status changes and concerns for CVA; MRI showed old CVAs; b.) LCH/PCI 06/10/2008: 100% mLAD (mechanical thrombectomy + 2.75 x 24 mm Micro-driver BMS), 49% mRCA, 89% oLM, 30% RI, 90% D2   ST elevation myocardial infarction (STEMI) of inferior wall (HCC) 03/16/2018   a.) LHC/PCI 03/16/2018: 70% mRCA (2.75 x 15 mm Resolute Onyx DES), 100% dRCA (overlapping  2.25 x 16 mm and 2.25 x 18 mm Resolute Oxyx DES), 75% oRPDA, 30% p-mLAD   ST elevation myocardial infarction (STEMI) of inferior wall (HCC) 06/19/2013   a.) LHC/PCI 06/20/2013: 99% dRCA (Xience Alpine DES)   Stroke (HCC)    a.) brain MRI 06/09/2008: areas of encephalomalacia involving the RIGHT frontal operculum and high RIGHT frontoparietal region c/w sequela of remote infarction   Tobacco abuse     Social History   Socioeconomic History   Marital status: Single    Spouse name: Not on file   Number of children: Not on file   Years of education: Not on file   Highest education level: Not on file  Occupational History   Not on file  Tobacco Use   Smoking status: Every Day    Current packs/day: 0.25    Average packs/day: 0.3 packs/day for 25.0 years (6.3 ttl pk-yrs)    Types: Cigarettes   Smokeless tobacco: Never  Vaping Use   Vaping status: Never Used  Substance and Sexual Activity   Alcohol use: No   Drug use: No   Sexual activity: Not on file  Other Topics Concern   Not on file  Social History Narrative   Lives with daughter   Social Drivers of Health   Financial Resource Strain: High Risk (03/08/2022)   Received from [facility]   Overall Financial Resource Strain (CARDIA)    Difficulty of Paying Living Expenses: Hard  Food Insecurity: No Food Insecurity (06/12/2023)   Hunger Vital Sign    Worried About Running Out of Food in the Last Year: Never true    Ran Out of Food in the Last Year: Never true  Transportation Needs: No Transportation Needs (06/12/2023)   PRAPARE - Administrator, Civil Service (Medical): No    Lack of Transportation (Non-Medical): No  Physical Activity: Inactive (03/08/2022)   Received from [facility]   Exercise Vital Sign    On average, how many days per week do you engage in moderate to strenuous exercise (like a brisk walk)?: 0 days    On average, how many minutes do you engage in exercise at  this level?: 0 min  Stress: Stress Concern Present (03/08/2022)   Received from [facility] - Occupational Stress Questionnaire    Feeling of Stress : Rather much  Social Connections: Moderately Isolated (03/08/2022)   Received from [facility]   Social Connection and Isolation Panel    In a typical  week, how many times do you talk on the phone with family, friends, or neighbors?: More than three times a week    How often do you get together with friends or relatives?: More than three times a week    How often do you attend church or religious services?: Never    Do you belong to any clubs or organizations such as church groups, unions, fraternal or athletic groups, or school groups?: No    How often do you attend meetings of the clubs or organizations you belong to?: Never    Are you married, widowed, divorced, separated, never married, or living with a partner?: Living with partner  Intimate Partner Violence: Not At Risk (06/12/2023)   Humiliation, Afraid, Rape, and Kick questionnaire    Fear of Current or Ex-Partner: No    Emotionally Abused: No    Physically Abused: No    Sexually Abused: No    Past Surgical History:  Procedure Laterality Date   ABDOMINAL HYSTERECTOMY     APPLICATION OF CELL SAVER N/A 06/12/2023   Procedure: APPLICATION OF CELL SAVER;  Surgeon: Marea Selinda RAMAN, MD;  Location: [facility] ORS;  Service: Vascular;  Laterality: N/A;   BACK SURGERY     CORONARY ANGIOPLASTY     CORONARY ANGIOPLASTY WITH STENT PLACEMENT Left 06/10/2008   Procedure: CORONARY ANGIOPLASTY WITH STENT PLACEMENT; Location: [facility]   CORONARY ANGIOPLASTY WITH STENT PLACEMENT Left 06/20/2013   Procedure: CORONARY ANGIOPLASTY WITH STENT PLACEMENT; Location: [facility]   CORONARY/GRAFT ACUTE MI REVASCULARIZATION N/A 03/16/2018   Procedure: Coronary/Graft Acute MI Revascularization;  Surgeon: Ammon Blunt, MD;  Location: [facility]  INVASIVE CV LAB;  Service: Cardiovascular;  Laterality: N/A;   ENDARTERECTOMY FEMORAL Left 06/12/2023   Procedure: ENDARTERECTOMY, FEMORAL;  Surgeon: Marea Selinda RAMAN, MD;  Location: [facility] OR[facility];  Service: Vascular;  Laterality: Left;   LEFT HEART CATH AND CORONARY ANGIOGRAPHY N/A 03/16/2018   Procedure: LEFT HEART CATH AND CORONARY ANGIOGRAPHY;  Surgeon: Ammon Blunt, MD;  Location: [facility] INVASIVE CV LAB;  Service: Cardiovascular;  Laterality: N/A;   LOWER EXTREMITY ANGIOGRAPHY Left 05/13/2023   Procedure: Lower Extremity Angiography;  Surgeon: Marea Selinda RAMAN, MD;  Location: [facility] INVASIVE CV LAB;  Service: Cardiovascular;  Laterality: Left;   THROAT SURGERY     THYROIDECTOMY      Family History  Problem Relation Age of Onset   Cancer Mother    Heart attack Father    Hypertension Daughter    Heart attack Daughter     No Known Allergies     Latest Ref Rng & Units 06/16/2023   11:34 AM 06/15/2023    4:42 AM 06/12/2023   11:37 PM  CBC  WBC 4.0 - 10.5 K/uL 9.2  8.0  7.3   Hemoglobin 12.0 - 15.0 g/dL 85.1  86.4  87.0   Hematocrit 36.0 - 46.0 % 44.8  41.3  37.8   Platelets 150 - 400 K/uL 185  168  149       CMP     Component Value Date/Time   NA 137 06/12/2023 2337   NA 138 06/20/2013 1722   K 4.6 06/12/2023 2337   K 4.7 06/20/2013 1722   CL 107 06/12/2023 2337   CL 106 06/20/2013 1722   CO2 24 06/12/2023 2337   CO2 22 06/20/2013 1722   GLUCOSE 120 (H) 06/12/2023 2337   GLUCOSE 161 (H) 06/20/2013 1722   BUN 9 06/12/2023 2337   BUN 11 06/20/2013 1722   CREATININE 0.57 06/12/2023 2337  CREATININE 0.45 (L) 06/20/2013 1722   CALCIUM  9.1 06/12/2023 2337   CALCIUM  9.3 06/20/2013 1722   PROT 8.0 07/31/2019 1141   PROT 8.3 (H) 06/20/2013 1722   ALBUMIN 3.8 07/31/2019 1141   ALBUMIN 3.7 06/20/2013 1722   AST 15 07/31/2019 1141   AST 28 06/20/2013 1722   ALT 12 07/31/2019 1141   ALT 16 06/20/2013 1722   ALKPHOS 98 07/31/2019 1141   ALKPHOS 128 (H) 06/20/2013 1722   BILITOT  1.3 (H) 07/31/2019 1141   BILITOT 0.5 06/20/2013 1722   GFRNONAA >60 06/12/2023 2337   GFRNONAA >60 06/20/2013 1722     No results found.     Assessment & Plan:   1. Peripheral arterial disease with history of revascularization (HCC) (Primary) Patient is now 1 month postop from left common femoral, profunda femoris, and superficial femoral artery endarterectomies with patch angioplasties. She returns today for 2-week follow-up for recheck of her left groin and incision wound. This is healing very well. It does have some scabbing but no signs or symptoms of infection, hematoma, or seroma. She does still continue to have +1 edema to her left lower extremity. I instructed her to rest, elevate and exercise as she continues to have some reperfusion syndrome with some pain.  Patient to follow-up as previously scheduled in 3 months with lower extremity vascular arterial ultrasounds with ABIs.  2. Essential hypertension Continue antihypertensive medications as already ordered, these medications have been reviewed and there are no changes at this time.   3. Tobacco abuse Smoking cessation was discussed, 3-10 minutes spent on this topic specifically.  Patient continues to smoke about half pack a day.  I encouraged her to stop smoking.  She states she is working on it but refuses any other treatment today.   4. Hyperlipidemia, unspecified hyperlipidemia type Continue statin as ordered and reviewed, no changes at this time   Current Outpatient Medications on File Prior to Visit  Medication Sig Dispense Refill   aspirin  81 MG chewable tablet Chew 1 tablet (81 mg total) by mouth daily. 180 tablet 0   atorvastatin  (LIPITOR) 40 MG tablet Take 1 tablet (40 mg total) by mouth daily at 6 PM. 90 tablet 3   clopidogrel  (PLAVIX ) 75 MG tablet Take 1 tablet (75 mg total) by mouth daily. 90 tablet 3   gabapentin  (NEURONTIN ) 300 MG capsule Take 1 capsule (300 mg total) by mouth 2 (two) times daily. 60 capsule 0    isosorbide mononitrate (IMDUR) 30 MG 24 hr tablet Take 30 mg by mouth daily.     lisinopril  (PRINIVIL ,ZESTRIL ) 5 MG tablet Take 1 tablet (5 mg total) by mouth daily. 180 tablet 0   losartan (COZAAR) 25 MG tablet Take 25 mg by mouth daily.     metoprolol  succinate (TOPROL  XL) 25 MG 24 hr tablet Take 1 tablet (25 mg total) by mouth daily. 180 tablet 0   nitroGLYCERIN  (NITROSTAT ) 0.4 MG SL tablet Place 1 tablet (0.4 mg total) under the tongue every 5 (five) minutes as needed for chest pain. 20 tablet 0   oxyCODONE -acetaminophen  (PERCOCET/ROXICET) 5-325 MG tablet Take 1 tablet by mouth every 4 (four) hours as needed for severe pain (pain score 7-10). 30 tablet 0   spironolactone  (ALDACTONE ) 25 MG tablet Take 1 tablet (25 mg total) by mouth daily. 30 tablet 0   sulfamethoxazole -trimethoprim  (BACTRIM  DS) 800-160 MG tablet Take 1 tablet by mouth 2 (two) times daily. 20 tablet 0   No current facility-administered medications on file  prior to visit.    There are no Patient Instructions on file for this visit. No follow-ups on file.   Gwendlyn JONELLE Shank, NP

## 2023-09-16 LAB — VAS US ABI WITH/WO TBI
Left ABI: 1.11
Right ABI: 1.1

## 2024-03-12 ENCOUNTER — Telehealth (INDEPENDENT_AMBULATORY_CARE_PROVIDER_SITE_OTHER): Payer: Self-pay | Admitting: Vascular Surgery

## 2024-03-12 NOTE — Telephone Encounter (Signed)
 Attempted to contact patient to r/s ultrasound and provider visit and was unable to leave a voicemail.

## 2024-03-16 ENCOUNTER — Encounter (INDEPENDENT_AMBULATORY_CARE_PROVIDER_SITE_OTHER)

## 2024-03-16 ENCOUNTER — Ambulatory Visit (INDEPENDENT_AMBULATORY_CARE_PROVIDER_SITE_OTHER): Admitting: Vascular Surgery
